# Patient Record
Sex: Male | Born: 1935 | Race: Black or African American | Hispanic: No | Smoking: Former smoker
Health system: Southern US, Community
[De-identification: ages and names within clinical notes are randomized; demographics above are authoritative.]

## PROBLEM LIST (undated history)

## (undated) DIAGNOSIS — R918 Other nonspecific abnormal finding of lung field: Secondary | ICD-10-CM

## (undated) DIAGNOSIS — M1990 Unspecified osteoarthritis, unspecified site: Secondary | ICD-10-CM

## (undated) DIAGNOSIS — J449 Chronic obstructive pulmonary disease, unspecified: Secondary | ICD-10-CM

## (undated) DIAGNOSIS — J479 Bronchiectasis, uncomplicated: Secondary | ICD-10-CM

## (undated) HISTORY — PX: ANKLE FRACTURE SURGERY: SHX122

## (undated) HISTORY — DX: Bronchiectasis, uncomplicated: J47.9

## (undated) HISTORY — DX: Other nonspecific abnormal finding of lung field: R91.8

## (undated) HISTORY — PX: REPLACEMENT TOTAL KNEE: SUR1224

---

## 1991-09-26 HISTORY — PX: ROTATOR CUFF REPAIR: SHX139

## 1998-10-27 ENCOUNTER — Encounter: Payer: Self-pay | Admitting: Emergency Medicine

## 1998-10-27 ENCOUNTER — Emergency Department (HOSPITAL_COMMUNITY): Admission: EM | Admit: 1998-10-27 | Discharge: 1998-10-27 | Payer: Self-pay | Admitting: Emergency Medicine

## 1998-11-08 ENCOUNTER — Ambulatory Visit (HOSPITAL_COMMUNITY): Admission: RE | Admit: 1998-11-08 | Discharge: 1998-11-08 | Payer: Self-pay | Admitting: Endocrinology

## 2000-04-05 ENCOUNTER — Ambulatory Visit (HOSPITAL_COMMUNITY): Admission: RE | Admit: 2000-04-05 | Discharge: 2000-04-05 | Payer: Self-pay | Admitting: Endocrinology

## 2001-05-20 ENCOUNTER — Encounter: Payer: Self-pay | Admitting: Pulmonary Disease

## 2001-05-20 ENCOUNTER — Ambulatory Visit: Admission: RE | Admit: 2001-05-20 | Discharge: 2001-05-20 | Payer: Self-pay | Admitting: Pulmonary Disease

## 2001-09-16 ENCOUNTER — Encounter: Admission: RE | Admit: 2001-09-16 | Discharge: 2001-09-16 | Payer: Self-pay | Admitting: Otolaryngology

## 2001-09-16 ENCOUNTER — Encounter: Payer: Self-pay | Admitting: Otolaryngology

## 2003-05-20 ENCOUNTER — Encounter: Payer: Self-pay | Admitting: Cardiology

## 2003-05-20 ENCOUNTER — Ambulatory Visit (HOSPITAL_COMMUNITY): Admission: RE | Admit: 2003-05-20 | Discharge: 2003-05-20 | Payer: Self-pay | Admitting: Cardiology

## 2004-01-22 ENCOUNTER — Ambulatory Visit (HOSPITAL_COMMUNITY): Admission: RE | Admit: 2004-01-22 | Discharge: 2004-01-22 | Payer: Self-pay | Admitting: Pulmonary Disease

## 2004-12-05 ENCOUNTER — Encounter: Admission: RE | Admit: 2004-12-05 | Discharge: 2004-12-05 | Payer: Self-pay | Admitting: Endocrinology

## 2004-12-23 ENCOUNTER — Ambulatory Visit: Payer: Self-pay | Admitting: Pulmonary Disease

## 2005-01-13 ENCOUNTER — Ambulatory Visit: Payer: Self-pay | Admitting: Pulmonary Disease

## 2005-04-28 ENCOUNTER — Ambulatory Visit: Payer: Self-pay | Admitting: Pulmonary Disease

## 2005-07-13 ENCOUNTER — Emergency Department (HOSPITAL_COMMUNITY): Admission: EM | Admit: 2005-07-13 | Discharge: 2005-07-14 | Payer: Self-pay | Admitting: Emergency Medicine

## 2006-09-25 HISTORY — PX: JOINT REPLACEMENT: SHX530

## 2006-09-27 ENCOUNTER — Inpatient Hospital Stay (HOSPITAL_COMMUNITY): Admission: RE | Admit: 2006-09-27 | Discharge: 2006-09-30 | Payer: Self-pay | Admitting: Orthopedic Surgery

## 2006-10-23 ENCOUNTER — Encounter: Admission: RE | Admit: 2006-10-23 | Discharge: 2006-11-21 | Payer: Self-pay | Admitting: Orthopedic Surgery

## 2007-10-04 ENCOUNTER — Inpatient Hospital Stay (HOSPITAL_COMMUNITY): Admission: AD | Admit: 2007-10-04 | Discharge: 2007-10-08 | Payer: Self-pay | Admitting: *Deleted

## 2008-12-28 ENCOUNTER — Emergency Department (HOSPITAL_COMMUNITY): Admission: EM | Admit: 2008-12-28 | Discharge: 2008-12-28 | Payer: Self-pay | Admitting: Emergency Medicine

## 2010-10-16 ENCOUNTER — Encounter: Payer: Self-pay | Admitting: Pulmonary Disease

## 2011-02-07 NOTE — Op Note (Signed)
NAMEMarland Kitchen  Nicholas Beasley, Nicholas Beasley NO.:  [id number]   MEDICAL RECORD NO.:  [id number]          PATIENT TYPE:  INP   LOCATION:  0012                         FACILITY:  [facility]   PHYSICIAN:  Edsel Petrin. Dalbert Batman, M.D.DATE OF BIRTH:  1926-11-29   DATE OF PROCEDURE:  10/04/2007  DATE OF DISCHARGE:                               OPERATIVE REPORT   PREOPERATIVE DIAGNOSIS:  Colonic perforation.   POSTOPERATIVE DIAGNOSIS:  Colonic perforation of rectosigmoid.   OPERATION PERFORMED:  Exploratory laparotomy and colorrhaphy x1.   SURGEON:  Dr. Fanny Skates.   FIRST ASSISTANT:  Dr. Michael Boston.   OPERATIVE INDICATIONS:  This is a 77 year old black man who was  undergoing elective screening colonoscopy today, and Dr. Lajoyce Corners recognized  a perforation upon entry at about 25 cm.  I was called to see the  patient.  He was brought to operating room urgently for repair of his  perforation.   OPERATIVE FINDINGS:  The patient had a solitary perforation of the  rectosigmoid.  This was noted on the right-hand side of the colonic wall  at about the level of the sacral promontory.  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  The colon appeared pink and  healthy and viable.  There was no gross fecal contamination, but there  was some cloudy watery fluid around the abdomen and pelvis.  There was  no odor.  The fluid was cultured.  I felt no other abnormalities within  the abdomen or pelvis.   OPERATIVE TECHNIQUE:  Following induction of general endotracheal  anesthesia, the patient's abdomen was prepped and draped in a sterile  fashion.  Foley catheter was inserted.  The patient was identified as  the correct patient and correct procedure.  Lower midline laparotomy was  made.  The fascia was incised in the midline and the abdominal cavity  entered and explored under direct vision.  We took aerobic and anaerobic  cultures of the peritoneal fluid.  We then packed away the small  bowel  and examined the entire sigmoid colon and distal descending colon and  the rectum.  We found a solitary perforation on the right lateral wall  of the very proximal rectum or very distal sigmoid.  This was not  bleeding.  It was not necrotic.  There was a little bit of its  mesenteric emphysema in the area but very localized.  I debrided the  edges of the perforation, cleaned off mesenteric fat and closed this in  two layers.  First layer was interrupted sutures of 3-0 Vicryl, taking  full-thickness and placed transversely.  The second layer was also  placed transversely with interrupted inverting sutures of 3-0 silk.  This appeared to be an excellent closure.   We then copiously irrigated the pelvis, abdomen and subphrenic spaces  with about 7 or 8 liters of saline until the fluid was completely clear.  We then removed all the laparotomy pads, checked the small bowel and  colon, and the repair and everything looked fine.  The omentum and small  bowel were returned to their  anatomic positions.  The midline fascia was  closed with a running suture of #1 double-stranded PDS.  The skin was  irrigated and the skin  closed loosely with skin staples, and we placed some Telfa wicks in  between the staples to promote drainage.  Clean bandages were placed,  and the patient was taken to the recovery room in stable condition.  Estimated blood loss was about 50 mL.  Complications none.  Sponge,  needle and instrument counts were correct.      Edsel Petrin. Dalbert Batman, M.D.  Electronically Signed     HMI/MEDQ  D:  10/04/2007  T:  10/04/2007  Job:  [id number]   cc:   Waverly Ferrari, M.D.  [phone]   Ronaldo Miyamoto, M.D.  Fax: [phone]

## 2011-02-07 NOTE — Op Note (Signed)
NAME:  Nicholas Beasley, Nicholas Beasley NO.:  [id number]   MEDICAL RECORD NO.:  [id number]          PATIENT TYPE:  AMB   LOCATION:  ENDO                         FACILITY:  [facility]   PHYSICIAN:  Waverly Ferrari, M.D.    DATE OF BIRTH:  11/02/1926   DATE OF PROCEDURE:  DATE OF DISCHARGE:                               OPERATIVE REPORT   PROCEDURE:  Colonoscopy.   INDICATIONS:  Colon cancer screening.   ANESTHESIA:  Fentanyl 50 mcg, Versed 3 mg.   PROCEDURE:  With the patient mildly sedated in the left lateral  decubitus position, a rectal exam was performed which was unremarkable.  Subsequently, the Pentax videoscopic colonoscope was inserted in the  rectum and passed under a short distance to the sigmoid at which point I  made a turn in the sigmoid and noticed some bleeding.  It was a  difficult turn.  As I pulled back, I noticed that there would have been  a perforation, so I withdrew the colonoscope and terminated the  procedure.   PLAN:  Admission to the hospital with surgical consultation for further  evaluation.           ______________________________  Waverly Ferrari, M.D.     GMO/MEDQ  D:  10/04/2007  T:  10/04/2007  Job:  [id number]

## 2011-02-07 NOTE — Consult Note (Signed)
NAMEMarland Kitchen  Nicholas Beasley, Nicholas Beasley NO.:  [id number]   MEDICAL RECORD NO.:  [id number]          PATIENT TYPE:  INP   LOCATION:  0012                         FACILITY:  [facility]   PHYSICIAN:  Edsel Petrin. Dalbert Batman, M.D.DATE OF BIRTH:  18-Jul-1927   DATE OF CONSULTATION:  DATE OF DISCHARGE:                                 CONSULTATION   REASON FOR CONSULTATION:  Evaluate colonic perforation.   HISTORY OF PRESENT ILLNESS:  This is a 77 year old black man, patient of  Dr. Jim Desanctis.  He has undergone a bowel prep and was undergoing  routine screening colonoscopy today by Dr. Jim Desanctis.  Dr. Lajoyce Corners stated  that he recognized a perforation upon entry at about 25 cm and aborted  the procedure at that time and called me to evaluate the patient.  I  examined the patient, reviewed his history discussed the situation with  his wife as well, and advised that he be taken to operating room for  exploratory laparotomy.   He does not have any history of colon polyps, colon cancer or family  history of colonic disease.  He has had some reflux and heartburn in the  past.  He had all of his questions answered and is in full agreement  with this plan.   PAST HISTORY:  1. Hyperlipidemia.  2. Asthma/bronchitis.  3. Hypertension.   CURRENT MEDICATIONS:  Crestor, Spiriva, Micardis.   DRUG ALLERGIES:  None known.   SOCIAL HISTORY:  The patient is married.  He is an ex-smoker and an ex-  drinker, stopping both of those in 1960.  He is a retired Careers adviser.   FAMILY HISTORY:  Negative for colon cancer or colitis.   REVIEW OF SYSTEMS:  All systems reviewed as best as possible.  They are  noncontributory except as described above.   PHYSICAL EXAMINATION:  Pleasant older black man in mild distress with  abdominal pain.  His wife is with him.  Blood pressure 120/74, pulse 59,  respiratory rate 18 and unlabored.  NECK:  No adenopathy.  No mass.  No crepitus.  No jugular distention.  LUNGS:  Clear to  auscultation.  Slightly distant breath sounds.  HEART:  Regular rate and rhythm.  I do not hear any murmurs or rubs.  ABDOMEN:  Slightly distended, some tenderness and mild guarding in the  lower abdomen but not very dramatic.  There is no mass, there is no  hernia, there are no incisions noted.  EXTREMITIES:  Moves all four extremities well without pain or deformity.  NEUROLOGIC:  No gross motor or sensory deficits.   ASSESSMENT:  1. Colonic perforation of rectosigmoid during elective colonoscopy.  2. Hypertension.  3. Hyperlipidemia.  4. Asthma and bronchitis.   PLAN:  1. The patient will be taken to the operating room for exploratory      laparotomy and repair of his colonic perforation.  2. I have discussed the indications and details of surgery with the      patient and his wife.  Risks and complications have been outlined,      including but  not limited to bleeding; infection; colostomy      formation; injury to adjacent organs with major reconstructive      surgery; wound infection; abscess; anastomotic leak; cardiac,      pulmonary and thromboembolic problems.  They seem to understand      these issues well.  At this time all their questions are answered.      They are in full agreement with this plan.      Edsel Petrin. Dalbert Batman, M.D.  Electronically Signed     HMI/MEDQ  D:  10/04/2007  T:  10/04/2007  Job:  [id number]   cc:   Waverly Ferrari, M.D.  [phone]   Ronaldo Miyamoto, M.D.  Fax: [phone]

## 2011-02-07 NOTE — Consult Note (Signed)
NAME:  JANES, STALLWORTH NO.:  [id number]   MEDICAL RECORD NO.:  [id number]          PATIENT TYPE:  AMB   LOCATION:  ENDO                         FACILITY:  [facility]   PHYSICIAN:  Waverly Ferrari, M.D.    DATE OF BIRTH:  [birth year]   DATE OF CONSULTATION:  10/04/2007  DATE OF DISCHARGE:                                 CONSULTATION   GASTROENTEROLOGY CONSULTATION   HISTORY:  Mr. Beardmore is a 77 year old gentleman being admitted for  followup of colonic perforation.   PAST MEDICAL HISTORY:  I would refer you to outpatient records attached  to the chart, but he has a history of chronic bronchitis, elevated  cholesterol, elevated blood pressure.  He underwent screening  colonoscopy for colon cancer screening.   MEDICATIONS:  His medications include Crestor, Spiriva and Micardis.   He had been well without complaints.   HABITS:  He is an ex-smoker, ex-drinker.   SOCIAL HISTORY:  He is a retired Careers adviser.   PHYSICAL EXAMINATION:  GENERAL APPEARANCE:  At this time he is alert,  oriented, pleasant and in no distress.  VITAL SIGNS:  Blood pressure 120/74, pulse 81, oxygen saturation is 94%.  HEENT:  Unremarkable.  NECK:  No bruits were appreciated.  LUNGS:  Clear.  HEART:  Has regular rhythm without murmurs, rubs or gallops.  ABDOMEN:  Actually fairly benign.   PLAN:  Will admit, get CBC, labs as per surgery and get an EKG and  abdominal films.  Surgery has been called.           ______________________________  Waverly Ferrari, M.D.     GMO/MEDQ  D:  10/04/2007  T:  10/04/2007  Job:  [id number]

## 2011-02-10 NOTE — Op Note (Signed)
[name], [name] NO.:  [id number]   MEDICAL RECORD NO.:  [id number]          PATIENT TYPE:  INP   LOCATION:  X003                         [facility]ACILITY:  [facility]   PHYSICIAN:  Nicholas Beasley, M.D.  DATE OF BIRTH:  August 02, 1927   DATE OF PROCEDURE:  09/27/2006  DATE OF DISCHARGE:                               OPERATIVE REPORT   PREOPERATIVE DIAGNOSIS:  End-stage left knee osteoarthritis.   POSTOPERATIVE DIAGNOSIS:  End-stage left knee osteoarthritis.   PROCEDURE:  Left total knee replacement.   COMPONENTS USED:  DePuy rotating platform, posterior stabilized knee  system with size 5 femur, 5 tibia, 10 mm insert and 41 patella button.   SURGEON:  Nicholas Beasley, M.D.   ASSISTANT:  Nicholas Blase, PA-C   ANESTHESIA:  Duramorph spinal plus MAC.   DRAINS:  Drains x1.   COMPLICATIONS:  None.   TOURNIQUET TIME:  67 minutes at 250 mmHg.   INDICATIONS:  Nicholas Beasley is a very pleasant 77 year old gentleman who  has been to the office with severe end-stage bilateral knee pain.  Radiographs revealed severe degenerative joint disease.  I reviewed with  him treatment options and after consideration of nonoperative versus  operative intervention.  He felt that he was not getting adequate  relief.  He wished to proceed with surgical intervention.  We reviewed  the risks of infection, DVT, component failure, need for revision  surgery, and persistent discomfort postoperatively as well as the  postoperative course.  Plan is for him to have a staged bilateral knees  with the first one done and then the second one done 4 weeks later to  incorporate this into one therapy session.   Consent was obtained.   PROCEDURE IN DETAIL:  The patient was brought to operative theater.  Once adequate anesthesia and preoperative antibiotics, 2 grams Ancef,  were administered the patient was positioned supine.  A proximal thigh  tourniquet was placed. Left lower extremity pre scrubbed and  prepped and  draped in sterile fashion.  A midline incision was made followed by  modified median parapatellar arthrotomy with a patella subluxated rather  than everted.  The patient was noted have severe tricompartmental  osteophytosis as well as large free floating loose bodies.  Following  knee exposure including meniscectomies and debridement of osteophytes to  decompress the joint, attention was directed to the femur.  Femoral  canal was opened, irrigated prevent fat emboli.  Intramedullary rod  passed.  Given the 5 degrees flexion and traction preoperatively I  resected 11 mm of bone off the distal femur.   I then sized the femur to be a size 5.  He had rather neutral position  compared to Whiteside's line so I ended up adding a little bit of  external rotation to the femoral component to make this perpendicular to  Whiteside's line, but also matching epicondylar axis.   Following the sizing determination rotation the size 5 cutting block was  placed. The anterior-posterior chamfer cuts were then made.  Further  debridement of osteophytes was carried out at this point, including  importantly underneath the MCL origin at the distal medial femoral  condyle.   At this point final femoral preparation was carried out with a box  trochlear cut with a size 5 jig.  This was based off the lateral aspect  of distal femur.   At this point attention was directed the tibia.  Further debridement was  carried out to allow for tibial exposure, retractors placed.  I resected  10 mm of bone with the stylus sitting on the lateral proximal tibia in  the central portion.   Following this cut.  There was noted be pretty sclerotic bone medial  proximal tibia and I drilled hole in this.   I sized the tibia cut surface to be best fit with size 5, positioned  this more the lateral aspect of it and debrided osteophytes and  overhanging medial proximal tibia.   Please note that I had placed an  extension block in place and the knee  came out of extension.  It was little tight but I knew that I had a lot  of decompression to continue so I was happy with this cut.  With the  final size 5 tray in place the alignment rod passed down the femur to  the tibia into the ankle and appeared to pass into the center of the  ankle in AP and lateral planes.   I went ahead and then drilled and keel punched the tibia and then a  trial reduction carried out. Again the knee lacked just a few degrees of  full extension but I knew it was tight and I had further debridement to  carry out and would plan to do another trial.  With these trial  components in place, attention was directed to the patella. Patella  precut measure was 25 mm.  I resected down to 15 mm and used a 41  patellar button. Post cut with the patella in place was up to 26 mm.   Following this the trial components removed and attention was directed  to further debridement of posterior aspect the knee with the use of  laminar spreaders. A 5/8 osteotome was used to debride posterior  osteophytes off the medial lateral aspects of the femur.  There was  significant decompression and these were very large osteophytes back  there.  Further debridement of osteophytes was carried out.   Once I was satisfied there was no loose bony fragments or any other  osteophytes that needed to be removed due to the distal palpitation I  went ahead and placed the trial components again with a 5 femur, 5 tibia  and a 10 insert. At this point the knee came out to full extension  easily with stable ligaments from extension to flexion and had a very  good feeling to it.   Given this these trial components removed. The knee was copiously  irrigated normal saline solution.  I injected the knee in the synovium  and capsule with 60 mL of Marcaine with epinephrine and 1 mL of Toradol. The final components were opened and brought into the field including  the  final insert.  Cement was prepared. Components were cemented in  position and tibia, femur then patella. Knee brought out to extension  with a trial insert.  Once the cement had cured, excessive cement was  debrided throughout the knee and none was visualized.  Once I was  satisfied of this the final insert was placed.  The knee was copiously  irrigated with pulse lavage normal saline solution. Prior to closure  medium Hemovac drain was placed, tourniquet was let down at 67 minutes.   With the knee in flexion.  I reapproximated extensor mechanism using #1  Vicryl. The remainder of the wound was closed with 2-0 Vicryl and 4-0  running Monocryl.  The knee was then cleaned, dried and dressed  sterilely with Steri-Strips, and a bulky sterile dressing.  He is  brought to recovery room in stable condition.      Nicholas Cassis Alvan Beasley, M.D.  Electronically Signed     MDO/MEDQ  D:  09/27/2006  T:  09/27/2006  Job:  [id number]

## 2011-02-10 NOTE — H&P (Signed)
NAME:  DANNY, CELIS NO.:  [id number]   MEDICAL RECORD NO.:  [id number]          PATIENT TYPE:  INP   LOCATION:  NA                           FACILITY:  [facility]   PHYSICIAN:  Pietro Cassis. Alvan Dame, M.D.  DATE OF BIRTH:  1927-01-26   DATE OF ADMISSION:  09/27/2006  DATE OF DISCHARGE:                              HISTORY & PHYSICAL   PROCEDURE:  Left total knee replacement.   HISTORY AND PHYSICAL/CHIEF COMPLAINT:  Left knee pain.   HISTORY OF PRESENT ILLNESS:  This is a 77 year old male with a history  of bilateral knee pain.  The left is greater than the right.  He has  been seen in our office for quite some time and has failed all  conservative measures including anti-inflammatories and steroid  injections.  Due to the increased pain and the decreased quality of  life, he has elected to proceed forward with total knee replacement.   PAST MEDICAL HISTORY:  Includes COPD, osteoarthritis,  hypercholesteremia.   PAST SURGICAL HISTORY:  Includes left ankle/right ankle fracture.   SOCIAL HISTORY:  Is married.  Wife will be primary caregiver after  surgery.   DRUG ALLERGIES:  NO KNOWN DRUG ALLERGIES.   MEDICATIONS:  Include Micardis, Crestor, Allegra, glucosamine, aspirins  and Spiriva.   REVIEW OF SYSTEMS:  No new signs or symptoms of any cardiovascular,  respiratory, gastrointestinal, genitourinary, neurologic or  musculoskeletal system complaints.   PHYSICAL EXAMINATION:  VITAL SIGNS:  Temperature 98.3, pulse 68,  respirations 18, blood pressure 142/82.  GENERAL:  Awake, alert and oriented, well-developed, well-nourished, in  no acute distress.  NECK:  Had no carotid bruits.  CHEST/LUNGS: Clear to auscultation bilaterally.  BREASTS:  Deferred.  HEART:  Regular rate and rhythm without gallops, clicks or rubs, [id number]  distinct.  ABDOMEN:  Soft, nontender, nondistended.  Bowel sounds present x4.  GENITOURINARY:  Deferred.  EXTREMITIES:  He has had genu varus.   He has -5 to 100 range of motion  with some pseudolaxity.  SKIN:  Dorsalis pedis pulses positive.  No cellulitis.  NEUROLOGIC:  Intact distal sensibilities.   LABS:  Are pending 09/20/2006 appointment at [facility].  He has been  to see his cardiologist who has done, EKG, chest x-rays as well as  stress echo.   X-rays are pending cardiologist visit.  Knee AP and lateral have been  taken showing end-stage degenerative changes.   IMPRESSION:  Left knee osteoarthritis.   PLAN OF ACTION:  Left total knee arthroplasty 09/27/2006 by surgeon Dr.  Paralee Cancel at [facility].  Risks and complications were  discussed.  Questions were encouraged, answered and reviewed.  We look  forward to treating this very pleasant 77 year old gentleman.     ______________________________  Carlean Jews. Collene Mares, [place]      Pietro Cassis. Alvan Dame, M.D.  Electronically Signed    BLM/MEDQ  D:  09/19/2006  T:  09/19/2006  Job:  [id number]

## 2011-02-10 NOTE — Discharge Summary (Signed)
NAMEMarland Kitchen  Nicholas Beasley, Nicholas Beasley NO.:  [id number]   MEDICAL RECORD NO.:  [id number]          PATIENT TYPE:  INP   LOCATION:  1517                         [facility]ACILITY:  [facility]   PHYSICIAN:  Edsel Petrin. Dalbert Batman, M.D.DATE OF BIRTH:  [birth year]   DATE OF ADMISSION:  10/04/2007  DATE OF DISCHARGE:  10/08/2007                               DISCHARGE SUMMARY   FINAL DIAGNOSES:  1. Accidental perforation of the rectosigmoid colon.  2. Hypertension.  3. Asthmatic bronchitis.  4. Hyperlipidemia.   OPERATIONS PERFORMED:  1. Colonoscopy.  2. Exploratory laparotomy and colorrhaphy.   HISTORY:  This is a 77 year old black man who has asthmatic bronchitis,  hyperlipidemia and hypertension.  He was undergoing routine screening  colonoscopy on the day of admission.  Dr. Lajoyce Corners recognized a perforation  that occurred at about the 25 cm level and called me immediately.  The  patient was admitted directly from the endoscopy unit to the operating  room for operative repair of his colonoscopic laceration.   PHYSICAL EXAMINATION:  A pleasant older black man in mild distress with  abdominal pain.  His wife was with him.  He was mildly sedated.  Blood pressure was 120/74, pulse 59, respiratory rate 18 and unlabored.  HEART:  Revealed a regular rate and rhythm, no murmurs.  ABDOMEN:  Slightly distended. Mild tenderness and mild guarding of the  lower abdomen but not very significant. No mass, no hernia, no  organomegaly, no incisions noted.  EXTREMITIES:  He moved all four extremities well without pain or  deformity.  NEUROLOGIC:  No gross motor or sensory deficits   HOSPITAL COURSE:  The patient was taken directly from the endoscopy  suite to the operating room where he  underwent exploratory laparotomy  through a lower midline incision. I found a very localized perforation  on the right-hand side of the proximal rectum or just at the  rectosigmoid junction. This was very localized, the tissues  were  healthy, there was no sign of any necrosis or bleeding. No sign of any  injury elsewhere.  This area was simply oversewn. As such, there was no  significant fecal contamination just a little bit of cloudy fluid.  Irrigation was performed.   Postoperatively the patient did well.  Peritoneal cultures did grow E-  coli which was no surprise.  He was maintained on intravenous  antibiotics. He had a low grade fever for a day or two but then that  resolved.  He had an ileus for a couple days but then he was able to  assume a diet without any trouble.   On January 13, he was tolerating a regular diet well, was passing  flatus, was afebrile, was asking to go home. His abdomen was soft and  nontender.  I allowed him discharge.  He was given a prescription for  Augmentin for three more days, Vicodin for pain and to continue his  usual medicines.  He was to return to see me in the office in 1 week.      Edsel Petrin. Dalbert Batman, M.D.  Electronically Signed  HMI/MEDQ  D:  11/04/2007  T:  11/04/2007  Job:  [id number]   cc:   Waverly Ferrari, M.D.  Fax: [phone]

## 2011-02-10 NOTE — Discharge Summary (Signed)
NAMEMarland Kitchen  BIKO, ROSENSTEIN NO.:  [id number]   MEDICAL RECORD NO.:  [id number]          PATIENT TYPE:  INP   LOCATION:  [place]                         FACILITY:  [facility]   PHYSICIAN:  Pietro Cassis. Alvan Dame, M.D.  DATE OF BIRTH:  24-Jul-1927   DATE OF ADMISSION:  09/27/2006  DATE OF DISCHARGE:  09/30/2006                               DISCHARGE SUMMARY   ADMITTING DIAGNOSES:  1. Osteoarthritis.  2. Hypertension.  3. Hypercholesteremia.  4. Chronic obstructive pulmonary disease.   DISCHARGE DIAGNOSES:  1. Osteoarthritis.  2. Hypercholesteremia.  3. Chronic obstructive pulmonary disease.  4. Hypertension.   CONSULTATIONS:  None.   PROCEDURE:  Left total knee arthroplasty.  Surgeon was Dr. Adriana Mccallum.  Assistant was [employer] PA.  Components:  Size 5 femur, size 10 insert,  tibial tray size 5 with a 41-mm patella.   BRIEF HISTORY OF PRESENT ILLNESS:  This 77 year old male with bilateral  knee pain with his left greater than right.  He has been seen in our  office for quite some time and is well known to our practice.  All  conservative measures have failed including anti-inflammatories and  steroid injections.  Due to decreased quality of life and persistent  progressive pain, he has been scheduled for a left total knee  replacement.   PREADMISSION LABORATORY:  Showed normal white blood cells, hemoglobin  13.5, hematocrit 39.8.  Upon admission, postoperatively, he was checked  on post-op day #1 and #2, which showed his hemoglobin to be 9.4, 27 on  post-op day #1.  On post-op day #2, he had a white blood cell 10.8,  hemoglobin 9, hematocrit 25.8.  Preadmission white cell differential  within normal limits.  Preadmission coagulation within normal limits.  Preadmission routine chemistry showed sodium 140, potassium 4.3, glucose  114., tracked throughout.  Post-op day #1 and post-op day #2 showed  stable sodium and potassium.  Glucose was 147 on post-op day #1.  Glucose was  125 on post-op day #2.  Preadmission urinalysis was  negative.  Chest x-ray preoperatively showed no acute cardiopulmonary  disease with some multilevel thoracic spondylosis.  EKG showed normal  sinus rhythm.   HOSPITAL COURSE:  The patient underwent left total knee replacement,  tolerated procedure well, was admitted to the orthopedic floor.  Post-op  day #1, pain was well-controlled.  His neuromuscular and vascular  functions remained intact throughout his course of stay.  He was  positive straight leg raise.  We discontinued the Hemovac on post-op day  #1.  We did give him a 500 mL IV bolus to help increase his blood  pressure.  Medicines were held to help increase his blood pressure until  his systolic was greater than 130.  Otherwise, we just followed him  postoperatively routinely.  Home health care PT evaluation recommended  by PT on initial evaluation.  OT recommendation was an elevatted commode  and home health care OT.  Post-op day #2, he was up in a chair.  He was  doing very well.  Dressing was changed and it was clean, dry, and  intact  with no ecchymosis, no ooze.  Post-op day #3, he continued to well.  Dressing was changed once again.  The wound was intact and well-  approximated with no ecchymosis.  His neuromuscular and vascular  functions remained intact with a positive straight leg raise.  Home  health care was recommended with the use of a rolling walker and a  raised commode.  Physical therapy found the patient progressed very well  and was able to ambulate with the use of a rolling walker 120 feet prior  to discharge.   DISCHARGE DISPOSITION:  Discharged home with home health care PT.   DISCHARGE PHYSICAL THERAPY:  Goals of physical therapy will be gait  retraining as well as proprioception.  Want to work on increasing his  range of motion, increasing strength, maximize range of motion,  minimizing pain, and encouraging independence in activities of daily  living.   He is weightbearing as tolerated with the use a rolling-walker.   DISCHARGE WOUND CARE:  Keep wound dry.  May shower, however, cover wound  with an impervious surface.  Redress with a dry dressing on a daily  basis.   DISCHARGE MEDICATIONS:  1. Crestor 10 mg, one p.o. q.a.m.  2. Garlic take as directed, one p.o. q.a.m.  3. Micardis HCTZ 40/12.5, one p.o. q.a.m.  4. Glucosamine chondroitin take as directed.  5. Fish oil omege-3, 1200 mg, one p.o. t.i.d.  6. Spiriva 18 mcg hand held inhaler use q.a.m.  7. Lovenox 30 mg, one subcutaneous q.12 h. x11 days.  8. Norco 05/325, one to two p.o. every four to six hours p.r.n. pain.  9. Robaxin 500 mg, one to two p.o. every four to six hours p.r.n.      muscle spasm pain.  10.Enteric-coated aspirin 325, one p.o. daily x4 weeks, after Lovenox      completed.  11.Ferrous sulfate 325, one p.o. t.i.d. x3 weeks.  12.Colace 100 mg, one p.o. b.i.d. p.r.n. constipation.   DISCHARGE FOLLOWUP:  Follow with Dr. Alvan Dame at [phone] in two weeks for  routine wound care check.  He may return to our office if his condition  worsens.  If he develops any acute shortness of breath or severe calf  pain, please call our emergency services immediately.     ______________________________  Carlean Jews Collene Mares, [place]      Pietro Cassis. Alvan Dame, M.D.  Electronically Signed    BLM/MEDQ  D:  11/01/2006  T:  11/01/2006  Job:  [id number]

## 2011-05-06 ENCOUNTER — Emergency Department (HOSPITAL_COMMUNITY)
Admission: EM | Admit: 2011-05-06 | Discharge: 2011-05-06 | Disposition: A | Discharge status: Home / Self Care | Payer: Medicare Other | Attending: Emergency Medicine | Admitting: Emergency Medicine

## 2011-05-06 ENCOUNTER — Emergency Department (HOSPITAL_COMMUNITY): Payer: Medicare Other

## 2011-05-06 DIAGNOSIS — J4489 Other specified chronic obstructive pulmonary disease: Secondary | ICD-10-CM | POA: Insufficient documentation

## 2011-05-06 DIAGNOSIS — J449 Chronic obstructive pulmonary disease, unspecified: Secondary | ICD-10-CM | POA: Insufficient documentation

## 2011-05-06 DIAGNOSIS — M79609 Pain in unspecified limb: Secondary | ICD-10-CM | POA: Insufficient documentation

## 2011-05-06 DIAGNOSIS — M7989 Other specified soft tissue disorders: Secondary | ICD-10-CM | POA: Insufficient documentation

## 2011-05-06 DIAGNOSIS — Y92009 Unspecified place in unspecified non-institutional (private) residence as the place of occurrence of the external cause: Secondary | ICD-10-CM | POA: Insufficient documentation

## 2011-05-06 DIAGNOSIS — IMO0002 Reserved for concepts with insufficient information to code with codable children: Secondary | ICD-10-CM | POA: Insufficient documentation

## 2011-06-15 LAB — COMPREHENSIVE METABOLIC PANEL
ALT: 15
AST: 22
Alkaline Phosphatase: 97
CO2: 27
Chloride: 105
GFR calc Af Amer: >60
GFR calc non Af Amer: 54 — ABNORMAL LOW
Sodium: 139
Total Bilirubin: 1

## 2011-06-15 LAB — DIFFERENTIAL
Basophils Absolute: 0
Eosinophils Absolute: 0.1
Lymphs Abs: 1.6
Monocytes Absolute: 0.5

## 2011-06-15 LAB — CBC
MCHC: 34.7
Platelets: 167

## 2011-06-15 LAB — BODY FLUID CULTURE

## 2011-06-15 LAB — ANAEROBIC CULTURE

## 2011-08-31 ENCOUNTER — Emergency Department (INDEPENDENT_AMBULATORY_CARE_PROVIDER_SITE_OTHER)
Admission: EM | Admit: 2011-08-31 | Discharge: 2011-08-31 | Disposition: A | Discharge status: Home / Self Care | Payer: Medicare Other | Source: Home / Self Care

## 2011-08-31 NOTE — ED Notes (Signed)
Pt called x1

## 2011-08-31 NOTE — ED Notes (Signed)
Pt called x 3 .  °

## 2011-08-31 NOTE — ED Notes (Signed)
Pt called x 2 

## 2011-12-11 ENCOUNTER — Inpatient Hospital Stay: Admit: 2011-12-11 | Payer: Medicare Other | Admitting: Orthopedic Surgery

## 2011-12-11 SURGERY — ARTHROPLASTY, KNEE, TOTAL
Anesthesia: Spinal | Laterality: Right

## 2012-06-07 NOTE — H&P (Signed)
   Nicholas Beasley is an 78 y.o. male.    Chief Complaint:   Right knee OA and pain   HPI: Pt is a 78 y.o. male complaining of right knee pain for 3-4 years. Pain and swelling have continually increased since the beginning. X-rays in the clinic show end-stage arthritic changes of the right knee. Pt has tried various conservative treatments which have failed to alleviate their symptoms, including joint aspiration, steroid injections and NSAIDs. Various options are discussed with the patient. Risks, benefits and expectations were discussed with the patient. Patient understand the risks, benefits and expectations and wishes to proceed with surgery.   PCP:  Dwan Bolt, MD  D/C Plans:   SNF - [place]  Post-op Meds:   No Rx given   Tranexamic Acid:   To be given  Decadron:   To be given  PMH: Tinnitus   PSH: Left total knee arthroplasty  Social History: He previously smoked, but quit at age 78. Previously drank alcohol, but quit in 1966.  Allergies:  NKDA  Medications: Denies any medications   ROS: Review of Systems  Constitutional: Negative.   HENT: Positive for tinnitus.   Eyes: Negative.   Respiratory: Negative.   Cardiovascular: Negative.   Gastrointestinal: Negative.   Genitourinary: Negative.   Musculoskeletal: Positive for joint pain.  Skin: Negative.   Neurological: Negative.   Endo/Heme/Allergies: Negative.   Psychiatric/Behavioral: Negative.      Physical Exam: BP:   160/84  ;  HR:   80  ; Resp:   16  : Physical Exam  Constitutional: He is oriented to person, place, and time and well-developed, well-nourished, and in no distress.  HENT:  Head: Normocephalic and atraumatic.  Nose: Nose normal.  Mouth/Throat: Oropharynx is clear and moist.  Eyes: Pupils are equal, round, and reactive to light.  Neck: Neck supple. No JVD present. No tracheal deviation present. No thyromegaly present.  Cardiovascular: Normal rate, regular rhythm, normal heart sounds  and intact distal pulses.   Pulmonary/Chest: Effort normal and breath sounds normal. No stridor. No respiratory distress. He has no wheezes.  Abdominal: Soft. There is no tenderness. There is no guarding.  Musculoskeletal:       Right knee: He exhibits decreased range of motion and swelling. He exhibits no effusion, no deformity and no laceration. tenderness found.  Lymphadenopathy:    He has no cervical adenopathy.  Neurological: He is alert and oriented to person, place, and time.  Skin: Skin is warm and dry.  Psychiatric: Affect normal.     Assessment/Plan Assessment:   Right knee OA and pain   Plan: Patient will undergo a right total knee arthroplasty on 06/18/2012 per Dr. Alvan Dame at [facility]. Risks benefits and expectations were discussed with the patient. Patient understand risks, benefits and expectations and wishes to proceed.   [place] Pugh Ameila Weldon   PAC  06/07/2012, 3:10 PM

## 2012-06-10 ENCOUNTER — Encounter (HOSPITAL_COMMUNITY): Payer: Self-pay | Admitting: Pharmacy Technician

## 2012-06-12 ENCOUNTER — Encounter (HOSPITAL_COMMUNITY): Payer: Self-pay

## 2012-06-12 ENCOUNTER — Encounter (HOSPITAL_COMMUNITY)
Admission: RE | Admit: 2012-06-12 | Discharge: 2012-06-12 | Disposition: A | Discharge status: Home / Self Care | Payer: Medicare Other | Source: Ambulatory Visit | Attending: Orthopedic Surgery | Admitting: Orthopedic Surgery

## 2012-06-12 HISTORY — DX: Unspecified osteoarthritis, unspecified site: M19.90

## 2012-06-12 HISTORY — DX: Chronic obstructive pulmonary disease, unspecified: J44.9

## 2012-06-12 LAB — CBC
Hemoglobin: 12.9 g/dL — ABNORMAL LOW (ref 13.0–17.0)
MCH: 28.2 pg (ref 26.0–34.0)
MCHC: 32.6 g/dL (ref 30.0–36.0)
MCV: 86.7 fL (ref 78.0–100.0)
Platelets: 309 10*3/uL (ref 150–400)
RBC: 4.57 MIL/uL (ref 4.22–5.81)

## 2012-06-12 LAB — URINALYSIS, ROUTINE W REFLEX MICROSCOPIC
Bilirubin Urine: NEGATIVE
Glucose, UA: NEGATIVE mg/dL
Hgb urine dipstick: NEGATIVE
Specific Gravity, Urine: 1.015 (ref 1.005–1.030)

## 2012-06-12 LAB — BASIC METABOLIC PANEL
BUN: 10 mg/dL (ref 6–23)
CO2: 26 mEq/L (ref 19–32)
Calcium: 9.7 mg/dL (ref 8.4–10.5)
Glucose, Bld: 101 mg/dL — ABNORMAL HIGH (ref 70–99)
Sodium: 138 mEq/L (ref 135–145)

## 2012-06-12 MED ORDER — CHLORHEXIDINE GLUCONATE 4 % EX LIQD
60.0000 mL | Freq: Once | CUTANEOUS | Status: DC
  Filled 2012-06-12: qty 60

## 2012-06-12 NOTE — Patient Instructions (Signed)
[name]  06/12/2012   Your procedure is scheduled on: 06/18/12 AT 10:30AM    Report to [facility]  At 8:00 AM.  Call this number if you have problems the morning of surgery: [phone]   Remember:   Do not eat food or drink liquids AFTER MIDNIGHT    Take these medicines the morning of surgery with A SIP OF WATER: NONE   Do not wear jewelry, make-up or nail polish.  Do not wear lotions, powders, or perfumes.   Do not shave legs or underarms 48 hrs. before surgery (men may shave face)  Do not bring valuables to the hospital.  Contacts, dentures or bridgework may not be worn into surgery.  Leave suitcase in the car. After surgery it may be brought to your room.  For patients admitted to the hospital, checkout time is 11:00 AM the day of discharge.   Patients discharged the day of surgery will not be allowed to drive home. If going home same day of surgery, must have someone stay with you first 24 hrs at home and arrange for some one to drive you home from hospital.    Special Instructions:   Please read over the following fact sheets that you were given: MRSA  Information               [facility] [place] / Incentive Spirometer          X_______________________________________________________________

## 2012-06-14 NOTE — Progress Notes (Signed)
I spoke with Ovid Curd ( caretaker for Mr Duty) and informed him of time change for surgery to 12:30 and pt to arrive at [facility] Stay at 10:30 am - may have water 12:00 am until 6:30 am

## 2012-06-18 ENCOUNTER — Encounter (HOSPITAL_COMMUNITY)
Admission: RE | Disposition: A | Discharge status: Skilled Nursing Facility | Payer: Self-pay | Source: Ambulatory Visit | Attending: Orthopedic Surgery

## 2012-06-18 ENCOUNTER — Encounter (HOSPITAL_COMMUNITY): Payer: Self-pay | Admitting: *Deleted

## 2012-06-18 ENCOUNTER — Encounter (HOSPITAL_COMMUNITY): Payer: Self-pay | Admitting: Anesthesiology

## 2012-06-18 ENCOUNTER — Inpatient Hospital Stay (HOSPITAL_COMMUNITY)
Admission: RE | Admit: 2012-06-18 | Discharge: 2012-06-21 | DRG: 470 | Disposition: A | Discharge status: Skilled Nursing Facility | Payer: Medicare Other | Source: Ambulatory Visit | Attending: Orthopedic Surgery | Admitting: Orthopedic Surgery

## 2012-06-18 ENCOUNTER — Inpatient Hospital Stay (HOSPITAL_COMMUNITY): Payer: Medicare Other | Admitting: Anesthesiology

## 2012-06-18 DIAGNOSIS — Z01812 Encounter for preprocedural laboratory examination: Secondary | ICD-10-CM

## 2012-06-18 DIAGNOSIS — J4489 Other specified chronic obstructive pulmonary disease: Secondary | ICD-10-CM | POA: Diagnosis present

## 2012-06-18 DIAGNOSIS — Z6826 Body mass index (BMI) 26.0-26.9, adult: Secondary | ICD-10-CM

## 2012-06-18 DIAGNOSIS — Y831 Surgical operation with implant of artificial internal device as the cause of abnormal reaction of the patient, or of later complication, without mention of misadventure at the time of the procedure: Secondary | ICD-10-CM | POA: Diagnosis not present

## 2012-06-18 DIAGNOSIS — E871 Hypo-osmolality and hyponatremia: Secondary | ICD-10-CM

## 2012-06-18 DIAGNOSIS — H9319 Tinnitus, unspecified ear: Secondary | ICD-10-CM | POA: Diagnosis present

## 2012-06-18 DIAGNOSIS — E663 Overweight: Secondary | ICD-10-CM | POA: Diagnosis present

## 2012-06-18 DIAGNOSIS — Z96659 Presence of unspecified artificial knee joint: Secondary | ICD-10-CM

## 2012-06-18 DIAGNOSIS — D62 Acute posthemorrhagic anemia: Secondary | ICD-10-CM

## 2012-06-18 DIAGNOSIS — J449 Chronic obstructive pulmonary disease, unspecified: Secondary | ICD-10-CM | POA: Diagnosis present

## 2012-06-18 DIAGNOSIS — M1710 Unilateral primary osteoarthritis, unspecified knee: Principal | ICD-10-CM | POA: Diagnosis present

## 2012-06-18 DIAGNOSIS — IMO0002 Reserved for concepts with insufficient information to code with codable children: Secondary | ICD-10-CM | POA: Diagnosis not present

## 2012-06-18 DIAGNOSIS — Z87891 Personal history of nicotine dependence: Secondary | ICD-10-CM

## 2012-06-18 HISTORY — PX: TOTAL KNEE ARTHROPLASTY: SHX125

## 2012-06-18 SURGERY — ARTHROPLASTY, KNEE, TOTAL
Anesthesia: Spinal | Site: Knee | Laterality: Right | Wound class: Clean

## 2012-06-18 MED ORDER — MIDAZOLAM HCL 5 MG/5ML IJ SOLN
INTRAMUSCULAR | Status: DC | PRN
  Administered 2012-06-18 (×2): 1 mg via INTRAVENOUS

## 2012-06-18 MED ORDER — CARBOXYMETH-GLYCERIN-POLYSORB 0.5-1-0.5 % OP SOLN: 1.0000 [drp] | Freq: Every day | OPHTHALMIC | Status: DC

## 2012-06-18 MED ORDER — PROMETHAZINE HCL 25 MG/ML IJ SOLN: 6.2500 mg | INTRAMUSCULAR | Status: DC | PRN

## 2012-06-18 MED ORDER — MENTHOL 3 MG MT LOZG: 1.0000 | LOZENGE | OROMUCOSAL | Status: DC | PRN

## 2012-06-18 MED ORDER — FERROUS SULFATE 325 (65 FE) MG PO TABS
325.0000 mg | ORAL_TABLET | Freq: Three times a day (TID) | ORAL | Status: DC
  Administered 2012-06-18 – 2012-06-21 (×8): 325 mg via ORAL
  Filled 2012-06-18 (×11): qty 1

## 2012-06-18 MED ORDER — FENTANYL CITRATE 0.05 MG/ML IJ SOLN: 25.0000 ug | INTRAMUSCULAR | Status: DC | PRN

## 2012-06-18 MED ORDER — BISACODYL 10 MG RE SUPP: 10.0000 mg | Freq: Every day | RECTAL | Status: DC | PRN

## 2012-06-18 MED ORDER — FENTANYL CITRATE 0.05 MG/ML IJ SOLN
INTRAMUSCULAR | Status: DC | PRN
  Administered 2012-06-18: 100 ug via INTRAVENOUS

## 2012-06-18 MED ORDER — 0.9 % SODIUM CHLORIDE (POUR BTL) OPTIME
TOPICAL | Status: DC | PRN
  Administered 2012-06-18: 1000 mL

## 2012-06-18 MED ORDER — DOCUSATE SODIUM 100 MG PO CAPS
100.0000 mg | ORAL_CAPSULE | Freq: Two times a day (BID) | ORAL | Status: DC
  Administered 2012-06-18 – 2012-06-21 (×6): 100 mg via ORAL

## 2012-06-18 MED ORDER — KETOROLAC TROMETHAMINE 30 MG/ML IJ SOLN
INTRAMUSCULAR | Status: DC | PRN
  Administered 2012-06-18: 30 mg via INTRAVENOUS

## 2012-06-18 MED ORDER — DIPHENHYDRAMINE HCL 25 MG PO CAPS: 25.0000 mg | ORAL_CAPSULE | Freq: Four times a day (QID) | ORAL | Status: DC | PRN

## 2012-06-18 MED ORDER — POLYETHYLENE GLYCOL 3350 17 G PO PACK
17.0000 g | PACK | Freq: Two times a day (BID) | ORAL | Status: DC
  Administered 2012-06-18 – 2012-06-21 (×5): 17 g via ORAL

## 2012-06-18 MED ORDER — ONDANSETRON HCL 4 MG PO TABS: 4.0000 mg | ORAL_TABLET | Freq: Four times a day (QID) | ORAL | Status: DC | PRN

## 2012-06-18 MED ORDER — PHENOL 1.4 % MT LIQD: 1.0000 | OROMUCOSAL | Status: DC | PRN

## 2012-06-18 MED ORDER — SODIUM CHLORIDE 0.9 % IR SOLN
Status: DC | PRN
  Administered 2012-06-18: 1000 mL

## 2012-06-18 MED ORDER — LACTATED RINGERS IV SOLN
INTRAVENOUS | Status: DC
  Administered 2012-06-18: 12:00:00 via INTRAVENOUS
  Administered 2012-06-18: 1000 mL via INTRAVENOUS

## 2012-06-18 MED ORDER — KETAMINE HCL 10 MG/ML IJ SOLN
INTRAMUSCULAR | Status: DC | PRN
  Administered 2012-06-18 (×4): 10 mg via INTRAVENOUS

## 2012-06-18 MED ORDER — HYDROCODONE-ACETAMINOPHEN 7.5-325 MG PO TABS
1.0000 | ORAL_TABLET | ORAL | Status: DC
  Administered 2012-06-18 (×3): 1 via ORAL
  Administered 2012-06-19: 2 via ORAL
  Administered 2012-06-19 (×3): 1 via ORAL
  Administered 2012-06-19: 2 via ORAL
  Administered 2012-06-19: 1 via ORAL
  Administered 2012-06-20: 2 via ORAL
  Administered 2012-06-20: 1 via ORAL
  Administered 2012-06-20 – 2012-06-21 (×3): 2 via ORAL
  Filled 2012-06-18 (×2): qty 2
  Filled 2012-06-18 (×2): qty 1
  Filled 2012-06-18: qty 2
  Filled 2012-06-18: qty 1
  Filled 2012-06-18: qty 2
  Filled 2012-06-18: qty 1
  Filled 2012-06-18 (×2): qty 2
  Filled 2012-06-18 (×3): qty 1
  Filled 2012-06-18: qty 2

## 2012-06-18 MED ORDER — CEFAZOLIN SODIUM-DEXTROSE 2-3 GM-% IV SOLR
2.0000 g | INTRAVENOUS | Status: AC
  Administered 2012-06-18: 2 g via INTRAVENOUS

## 2012-06-18 MED ORDER — DEXAMETHASONE SODIUM PHOSPHATE 10 MG/ML IJ SOLN
10.0000 mg | Freq: Once | INTRAMUSCULAR | Status: AC
  Administered 2012-06-19: 10 mg via INTRAVENOUS
  Filled 2012-06-18: qty 1

## 2012-06-18 MED ORDER — ALUM & MAG HYDROXIDE-SIMETH 200-200-20 MG/5ML PO SUSP: 30.0000 mL | ORAL | Status: DC | PRN

## 2012-06-18 MED ORDER — SODIUM CHLORIDE 0.9 % IV SOLN
INTRAVENOUS | Status: AC
  Administered 2012-06-18 – 2012-06-19 (×2): via INTRAVENOUS
  Filled 2012-06-18 (×9): qty 1000

## 2012-06-18 MED ORDER — HYDROMORPHONE HCL PF 1 MG/ML IJ SOLN: 0.5000 mg | INTRAMUSCULAR | Status: DC | PRN

## 2012-06-18 MED ORDER — DEXAMETHASONE SODIUM PHOSPHATE 10 MG/ML IJ SOLN
10.0000 mg | Freq: Once | INTRAMUSCULAR | Status: AC
  Administered 2012-06-18: 10 mg via INTRAVENOUS
  Filled 2012-06-18: qty 1

## 2012-06-18 MED ORDER — TRANEXAMIC ACID 100 MG/ML IV SOLN
15.0000 mg/kg | Freq: Once | INTRAVENOUS | Status: AC
  Administered 2012-06-18: 1170 mg via INTRAVENOUS
  Filled 2012-06-18: qty 11.7

## 2012-06-18 MED ORDER — LACTATED RINGERS IV SOLN: INTRAVENOUS | Status: DC

## 2012-06-18 MED ORDER — CELECOXIB 200 MG PO CAPS
200.0000 mg | ORAL_CAPSULE | Freq: Two times a day (BID) | ORAL | Status: DC
  Administered 2012-06-18 – 2012-06-21 (×6): 200 mg via ORAL
  Filled 2012-06-18 (×7): qty 1

## 2012-06-18 MED ORDER — RIVAROXABAN 10 MG PO TABS
10.0000 mg | ORAL_TABLET | ORAL | Status: DC
  Administered 2012-06-19 – 2012-06-21 (×3): 10 mg via ORAL
  Filled 2012-06-18 (×5): qty 1

## 2012-06-18 MED ORDER — ONDANSETRON HCL 4 MG/2ML IJ SOLN: 4.0000 mg | Freq: Four times a day (QID) | INTRAMUSCULAR | Status: DC | PRN

## 2012-06-18 MED ORDER — METHOCARBAMOL 100 MG/ML IJ SOLN
500.0000 mg | Freq: Four times a day (QID) | INTRAVENOUS | Status: DC | PRN
  Administered 2012-06-18: 500 mg via INTRAVENOUS
  Filled 2012-06-18: qty 5

## 2012-06-18 MED ORDER — PROPOFOL 10 MG/ML IV EMUL
INTRAVENOUS | Status: DC | PRN
  Administered 2012-06-18: 50 ug/kg/min via INTRAVENOUS

## 2012-06-18 MED ORDER — POLYVINYL ALCOHOL 1.4 % OP SOLN
1.0000 [drp] | Freq: Every day | OPHTHALMIC | Status: DC
  Administered 2012-06-18 – 2012-06-21 (×3): 1 [drp] via OPHTHALMIC
  Filled 2012-06-18 (×2): qty 15

## 2012-06-18 MED ORDER — BUPIVACAINE-EPINEPHRINE PF 0.25-1:200000 % IJ SOLN
INTRAMUSCULAR | Status: DC | PRN
  Administered 2012-06-18: 50 mL

## 2012-06-18 MED ORDER — CEFAZOLIN SODIUM-DEXTROSE 2-3 GM-% IV SOLR
2.0000 g | Freq: Four times a day (QID) | INTRAVENOUS | Status: AC
  Administered 2012-06-18 (×2): 2 g via INTRAVENOUS
  Filled 2012-06-18 (×2): qty 50

## 2012-06-18 MED ORDER — FLEET ENEMA 7-19 GM/118ML RE ENEM: 1.0000 | ENEMA | Freq: Once | RECTAL | Status: AC | PRN

## 2012-06-18 MED ORDER — BUPIVACAINE IN DEXTROSE 0.75-8.25 % IT SOLN
INTRATHECAL | Status: DC | PRN
  Administered 2012-06-18: 2 mL via INTRATHECAL

## 2012-06-18 MED ORDER — METHOCARBAMOL 500 MG PO TABS
500.0000 mg | ORAL_TABLET | Freq: Four times a day (QID) | ORAL | Status: DC | PRN
  Administered 2012-06-19: 500 mg via ORAL
  Filled 2012-06-18: qty 1

## 2012-06-18 MED ORDER — ACETAMINOPHEN 10 MG/ML IV SOLN
INTRAVENOUS | Status: DC | PRN
  Administered 2012-06-18: 1000 mg via INTRAVENOUS

## 2012-06-18 MED ORDER — ZOLPIDEM TARTRATE 5 MG PO TABS: 5.0000 mg | ORAL_TABLET | Freq: Every evening | ORAL | Status: DC | PRN

## 2012-06-18 SURGICAL SUPPLY — 59 items
ADH SKN CLS APL DERMABOND .7 (GAUZE/BANDAGES/DRESSINGS) ×1
BAG SPEC THK2 15X12 ZIP CLS (MISCELLANEOUS) ×1
BAG ZIPLOCK 12X15 (MISCELLANEOUS) ×2 IMPLANT
BANDAGE ELASTIC 6 VELCRO ST LF (GAUZE/BANDAGES/DRESSINGS) ×2 IMPLANT
BANDAGE ESMARK 6X9 LF (GAUZE/BANDAGES/DRESSINGS) ×1 IMPLANT
BLADE SAW SGTL 13.0X1.19X90.0M (BLADE) ×2 IMPLANT
BNDG CMPR 9X6 STRL LF SNTH (GAUZE/BANDAGES/DRESSINGS) ×1
BNDG ESMARK 6X9 LF (GAUZE/BANDAGES/DRESSINGS) ×2
BOWL SMART MIX CTS (DISPOSABLE) ×2 IMPLANT
CEMENT HV SMART SET (Cement) ×2 IMPLANT
CLOTH BEACON ORANGE TIMEOUT ST (SAFETY) ×2 IMPLANT
CUFF TOURN SGL QUICK 34 (TOURNIQUET CUFF) ×2
CUFF TRNQT CYL 34X4X40X1 (TOURNIQUET CUFF) ×1 IMPLANT
DECANTER SPIKE VIAL GLASS SM (MISCELLANEOUS) ×2 IMPLANT
DERMABOND ADVANCED (GAUZE/BANDAGES/DRESSINGS) ×1
DERMABOND ADVANCED .7 DNX12 (GAUZE/BANDAGES/DRESSINGS) ×1 IMPLANT
DRAPE EXTREMITY T 121X128X90 (DRAPE) ×2 IMPLANT
DRAPE POUCH INSTRU U-SHP 10X18 (DRAPES) ×2 IMPLANT
DRAPE U-SHAPE 47X51 STRL (DRAPES) ×2 IMPLANT
DRSG AQUACEL AG ADV 3.5X10 (GAUZE/BANDAGES/DRESSINGS) ×2 IMPLANT
DRSG TEGADERM 4X4.75 (GAUZE/BANDAGES/DRESSINGS) ×2 IMPLANT
DURAPREP 26ML APPLICATOR (WOUND CARE) ×2 IMPLANT
ELECT REM PT RETURN 9FT ADLT (ELECTROSURGICAL) ×2
ELECTRODE REM PT RTRN 9FT ADLT (ELECTROSURGICAL) ×1 IMPLANT
EVACUATOR 1/8 PVC DRAIN (DRAIN) ×2 IMPLANT
FACESHIELD LNG OPTICON STERILE (SAFETY) ×10 IMPLANT
GAUZE SPONGE 2X2 8PLY STRL LF (GAUZE/BANDAGES/DRESSINGS) ×1 IMPLANT
GLOVE BIOGEL PI IND STRL 7.5 (GLOVE) ×1 IMPLANT
GLOVE BIOGEL PI IND STRL 8 (GLOVE) ×1 IMPLANT
GLOVE BIOGEL PI INDICATOR 7.5 (GLOVE) ×1
GLOVE BIOGEL PI INDICATOR 8 (GLOVE) ×1
GLOVE ECLIPSE 8.0 STRL XLNG CF (GLOVE) ×2 IMPLANT
GLOVE ORTHO TXT STRL SZ7.5 (GLOVE) ×4 IMPLANT
GOWN BRE IMP PREV XXLGXLNG (GOWN DISPOSABLE) ×4 IMPLANT
GOWN STRL NON-REIN LRG LVL3 (GOWN DISPOSABLE) ×2 IMPLANT
HANDPIECE INTERPULSE COAX TIP (DISPOSABLE) ×2
IMMOBILIZER KNEE 20 (SOFTGOODS) ×2
IMMOBILIZER KNEE 20 THIGH 36 (SOFTGOODS) IMPLANT
KIT BASIN OR (CUSTOM PROCEDURE TRAY) ×2 IMPLANT
MANIFOLD NEPTUNE II (INSTRUMENTS) ×2 IMPLANT
NDL SAFETY ECLIPSE 18X1.5 (NEEDLE) ×1 IMPLANT
NEEDLE HYPO 18GX1.5 SHARP (NEEDLE) ×2
NS IRRIG 1000ML POUR BTL (IV SOLUTION) ×4 IMPLANT
PACK TOTAL JOINT (CUSTOM PROCEDURE TRAY) ×2 IMPLANT
POSITIONER SURGICAL ARM (MISCELLANEOUS) ×2 IMPLANT
SET HNDPC FAN SPRY TIP SCT (DISPOSABLE) ×1 IMPLANT
SET PAD KNEE POSITIONER (MISCELLANEOUS) ×2 IMPLANT
SPONGE GAUZE 2X2 STER 10/PKG (GAUZE/BANDAGES/DRESSINGS) ×1
SUCTION FRAZIER 12FR DISP (SUCTIONS) ×2 IMPLANT
SUT MNCRL AB 4-0 PS2 18 (SUTURE) ×2 IMPLANT
SUT VIC AB 1 CT1 36 (SUTURE) ×6 IMPLANT
SUT VIC AB 2-0 CT1 27 (SUTURE) ×6
SUT VIC AB 2-0 CT1 TAPERPNT 27 (SUTURE) ×3 IMPLANT
SYR 50ML LL SCALE MARK (SYRINGE) ×2 IMPLANT
TOWEL OR 17X26 10 PK STRL BLUE (TOWEL DISPOSABLE) ×4 IMPLANT
TRAY FOLEY CATH 14FRSI W/METER (CATHETERS) ×2 IMPLANT
WATER STERILE IRR 1500ML POUR (IV SOLUTION) ×2 IMPLANT
WRAP KNEE MAXI GEL POST OP (GAUZE/BANDAGES/DRESSINGS) ×2 IMPLANT
YANKAUER SUCT BULB TIP NO VENT (SUCTIONS) ×1 IMPLANT

## 2012-06-18 NOTE — Interval H&P Note (Signed)
History and Physical Interval Note:  06/18/2012 10:37 AM  Nicholas Beasley  has presented today for surgery, with the diagnosis of right knee osteoarthritis  The various methods of treatment have been discussed with the patient and family. After consideration of risks, benefits and other options for treatment, the patient has consented to  Procedure(s) (LRB) with comments: TOTAL KNEE ARTHROPLASTY (Right) as a surgical intervention .  The patient's history has been reviewed, patient examined, no change in status, stable for surgery.  I have reviewed the patient's chart and labs.  Questions were answered to the patient's satisfaction.     [name]

## 2012-06-18 NOTE — Anesthesia Postprocedure Evaluation (Signed)
Anesthesia Post Note  Patient: Nicholas Beasley  Procedure(s) Performed: Procedure(s) (LRB): TOTAL KNEE ARTHROPLASTY (Right)  Anesthesia type: Spinal  Patient location: PACU  Post pain: Pain level controlled  Post assessment: Post-op Vital signs reviewed  Last Vitals:  Filed Vitals:   06/18/12 1406  BP:   Pulse:   Temp: 36.4 C  Resp:     Post vital signs: Reviewed  Level of consciousness: sedated  Complications: No apparent anesthesia complications

## 2012-06-18 NOTE — Op Note (Signed)
NAME:  Nicholas Beasley                      MEDICAL RECORD NO.:  [id number]                             FACILITY:  [facility]      PHYSICIAN:  Pietro Cassis. Alvan Dame, M.D.  DATE OF BIRTH:  [birth year]      DATE OF PROCEDURE:  06/18/2012                                     OPERATIVE REPORT         PREOPERATIVE DIAGNOSIS:  Right knee osteoarthritis.      POSTOPERATIVE DIAGNOSIS:  Right knee osteoarthritis.      FINDINGS:  The patient was noted to have complete loss of cartilage and   bone-on-bone arthritis with associated osteophytes in all 3 compartments of   the knee with a significant synovitis and very large effusion.      PROCEDURE:  Right total knee replacement.      COMPONENTS USED:  DePuy rotating platform posterior stabilized knee   system, a size 5 femur, 5 tibia, 12.5 mm insert, and 41 patellar   button.      SURGEON:  Pietro Cassis. Alvan Dame, M.D.      ASSISTANT:  Danae Orleans, PA-C.      ANESTHESIA:  Spinal.      SPECIMENS:  None.      COMPLICATION:  None.      DRAINS:  One Hemovac.  EBL: <200      TOURNIQUET TIME:   Total Tourniquet Time Documented: Thigh (Right) - 46 minutes .      The patient was stable to the recovery room.      INDICATION FOR PROCEDURE:  Nicholas Beasley is a 78 y.o. male patient of   mine.  The patient had been seen, evaluated, and treated conservatively in the   office with medication, activity modification, and injections.  The patient had   radiographic changes of bone-on-bone arthritis with endplate sclerosis and osteophytes noted.      The patient failed conservative measures including medication, injections, and activity modification, and at this point was ready for more definitive measures.   Based on the radiographic changes and failed conservative measures, the patient   decided to proceed with total knee replacement.  Risks of infection,   DVT, component failure, need for revision surgery, postop course, and   expectations were all   discussed  and reviewed.  Consent was obtained for benefit of pain   relief.      PROCEDURE IN DETAIL:  The patient was brought to the operative theater.   Once adequate anesthesia, preoperative antibiotics, 2 gm of Ancef administered, the patient was positioned supine with the right thigh tourniquet placed.  The  right lower extremity was prepped and draped in sterile fashion.  A time-   out was performed identifying the patient, planned procedure, and   extremity.      The right lower extremity was placed in the [facility] leg holder.  The leg was   exsanguinated, tourniquet elevated to 250 mmHg.  A midline incision was   made followed by median parapatellar arthrotomy.  Following initial   exposure, attention was first directed to the patella.  Precut  measurement was noted to be 25 mm.  I resected down to 15 mm and used a   41 patellar button to restore patellar height as well as cover the cut   surface.      The lug holes were drilled and a metal shim was placed to protect the   patella from retractors and saw blades.      At this point, attention was now directed to the femur.  The femoral   canal was opened with a drill, irrigated to try to prevent fat emboli.  An   intramedullary rod was passed at 5 degrees valgus, 10 mm of bone was   resected off the distal femur.  Following this resection, the tibia was   subluxated anteriorly.  Using the extramedullary guide, 10 mm of bone was resected off   the proximal lateral tibia.  We confirmed the gap would be   stable medially and laterally with a 10 mm insert as well as confirmed   the cut was perpendicular in the coronal plane, checking with an alignment rod.      Once this was done, I sized the femur to be a size 5 in the anterior-   posterior dimension, chose a standard component based on medial and   lateral dimension.  The size 5 rotation block was then pinned in   position anterior referenced using the C-clamp to set rotation.  The    anterior, posterior, and  chamfer cuts were made without difficulty nor   notching making certain that I was along the anterior cortex to help   with flexion gap stability.      The final box cut was made off the lateral aspect of distal femur.      At this point, the tibia was sized to be a size 5, the size 5 tray was   then pinned in position through the medial third of the tubercle,   drilled, and keel punched.  Trial reduction was now carried with a 5 femur,  5 tibia, a 12.5 mm insert, and the 41 patella botton.  The knee was brought to   extension, full extension with good flexion stability with the patella   tracking through the trochlea without application of pressure.  Given   all these findings, the trial components removed.  Final components were   opened and cement was mixed.  The knee was irrigated with normal saline   solution and pulse lavage.  The synovial lining was   then injected with 0.25% Marcaine with epinephrine and 1 cc of Toradol,   total of 61 cc.      The knee was irrigated.  Final implants were then cemented onto clean and   dried cut surfaces of bone with the knee brought to extension with a 12.5   mm trial insert.      Once the cement had fully cured, the excess cement was removed   throughout the knee.  I confirmed I was satisfied with the range of   motion and stability, and the final 12.5 mm PSinsert was chosen.  It was   placed into the knee.      The tourniquet had been let down at 46 minutes.  No significant   hemostasis required.  The medium Hemovac drain was placed deep.  The   extensor mechanism was then reapproximated using #1 Vicryl with the knee   in flexion.  The   remaining wound was closed with 2-0 Vicryl  and running 4-0 Monocryl.   The knee was cleaned, dried, dressed sterilely using Dermabond and   Aquacel dressing.  Drain site dressed separately.  The patient was then   brought to recovery room in stable condition, tolerating the  procedure   well.   Please note that Physician Assistant, Danae Orleans, was present for the entirety of the case, and was utilized for pre-operative positioning, peri-operative retractor management, general facilitation of the procedure.  He was also utilized for primary wound closure at the end of the case.              Pietro Cassis Alvan Dame, M.D.

## 2012-06-18 NOTE — Transfer of Care (Signed)
Immediate Anesthesia Transfer of Care Note  Patient: Nicholas Beasley  Procedure(s) Performed: Procedure(s) (LRB) with comments: TOTAL KNEE ARTHROPLASTY (Right)  Patient Location: PACU  Anesthesia Type: Spinal  Level of Consciousness: awake, oriented and patient cooperative  Airway & Oxygen Therapy: Patient Spontanous Breathing and Patient connected to face mask oxygen  Post-op Assessment: Report given to PACU RN, Post -op Vital signs reviewed and stable and SAB level L1.  Post vital signs: Reviewed and stable  Complications: No apparent anesthesia complications

## 2012-06-18 NOTE — Anesthesia Procedure Notes (Signed)
Spinal  Patient location during procedure: OR Start time: 06/18/2012 11:45 AM End time: 06/18/2012 11:55 AM Staffing Anesthesiologist: Freddie Apley F Performed by: anesthesiologist  Preanesthetic Checklist Completed: patient identified, site marked, surgical consent, pre-op evaluation, timeout performed, IV checked, risks and benefits discussed and monitors and equipment checked Spinal Block Patient position: sitting Prep: Betadine Patient monitoring: heart rate, continuous pulse ox and blood pressure Approach: midline Location: L2-3 Injection technique: single-shot Needle Needle type: Quincke  Needle gauge: 22 G Needle length: 9 cm Additional Notes Expiration date of kit checked and confirmed. Patient tolerated procedure well, without complications. Minimal CSF return despite attempts at SAB at L3-4 without success and then  L2-3. Negative heme/paresthesia Lot [id number] DOE 08/2013

## 2012-06-18 NOTE — Anesthesia Preprocedure Evaluation (Signed)
Anesthesia Evaluation  Patient identified by MRN, date of birth, ID band Patient awake    Reviewed: Allergy & Precautions, H&P , NPO status , Patient's Chart, lab work & pertinent test results  Airway Mallampati: II TM Distance: >3 FB Neck ROM: Full    Dental  (+) Teeth Intact, Dental Advisory Given and Caps,    Pulmonary neg pulmonary ROS, COPD breath sounds clear to auscultation  Pulmonary exam normal       Cardiovascular negative cardio ROS  Rhythm:Regular Rate:Normal     Neuro/Psych negative neurological ROS  negative psych ROS   GI/Hepatic negative GI ROS, Neg liver ROS,   Endo/Other  negative endocrine ROS  Renal/GU negative Renal ROS  negative genitourinary   Musculoskeletal negative musculoskeletal ROS (+)   Abdominal   Peds negative pediatric ROS (+)  Hematology negative hematology ROS (+)   Anesthesia Other Findings   Reproductive/Obstetrics negative OB ROS                           Anesthesia Physical Anesthesia Plan  ASA: II  Anesthesia Plan: Spinal   Post-op Pain Management:    Induction: Intravenous  Airway Management Planned: Simple Face Mask  Additional Equipment:   Intra-op Plan:   Post-operative Plan: Extubation in OR  Informed Consent: I have reviewed the patients History and Physical, chart, labs and discussed the procedure including the risks, benefits and alternatives for the proposed anesthesia with the patient or authorized representative who has indicated his/her understanding and acceptance.   Dental advisory given  Plan Discussed with: CRNA  Anesthesia Plan Comments:         Anesthesia Quick Evaluation

## 2012-06-19 ENCOUNTER — Encounter (HOSPITAL_COMMUNITY): Payer: Self-pay | Admitting: Orthopedic Surgery

## 2012-06-19 DIAGNOSIS — E663 Overweight: Secondary | ICD-10-CM | POA: Insufficient documentation

## 2012-06-19 LAB — CBC
Hemoglobin: 8.5 g/dL — ABNORMAL LOW (ref 13.0–17.0)
RBC: 2.91 MIL/uL — ABNORMAL LOW (ref 4.22–5.81)
WBC: 13.4 10*3/uL — ABNORMAL HIGH (ref 4.0–10.5)

## 2012-06-19 LAB — BASIC METABOLIC PANEL
CO2: 24 mEq/L (ref 19–32)
Chloride: 101 mEq/L (ref 96–112)
Glucose, Bld: 157 mg/dL — ABNORMAL HIGH (ref 70–99)
Potassium: 4.4 mEq/L (ref 3.5–5.1)
Sodium: 133 mEq/L — ABNORMAL LOW (ref 135–145)

## 2012-06-19 NOTE — Evaluation (Addendum)
Physical Therapy Evaluation Patient Details Name: Nicholas Beasley MRN: [id number] DOB: 1927/02/18 Today's Date: 06/19/2012 Time: [id number] PT Time Calculation (min): 28 min  PT Assessment / Plan / Recommendation Clinical Impression  pt is s/p right TKA and will benefit from PT to maximize independence in preparation for next venue of care    PT Assessment  Patient needs continued PT services    Follow Up Recommendations  Skilled nursing facility (pt planning for [place])    Barriers to Discharge        Equipment Recommendations  None recommended by PT    Recommendations for Other Services     Frequency 7X/week    Precautions / Restrictions Precautions Precautions: Knee Restrictions Weight Bearing Restrictions: No RLE Weight Bearing: Weight bearing as tolerated   Pertinent Vitals/Pain       Mobility  Bed Mobility Bed Mobility: Supine to Sit Supine to Sit: 4: Min guard Details for Bed Mobility Assistance:  verbal cues for technique Transfers Transfers: Sit to Stand;Stand to Sit Sit to Stand: 4: Min guard;From bed Stand to Sit: 4: Min guard;To chair/3-in-1 Details for Transfer Assistance: cues for hand placement; Ambulation/Gait Ambulation/Gait Assistance: 4: Min guard Ambulation Distance (Feet): 75 Feet Assistive device: Rolling walker Ambulation/Gait Assistance Details: verbal cues for sequence Rw distance from self Gait Pattern: Step-to pattern;Step-through pattern    Exercises Total Joint Exercises Ankle Circles/Pumps: AROM;15 reps;Both Quad Sets: AROM;Right;10 reps Heel Slides: AROM;AAROM;10 reps;Right Straight Leg Raises: AROM;AAROM;Right;5 reps   PT Diagnosis: Difficulty walking  PT Problem List: Decreased mobility;Decreased activity tolerance;Decreased range of motion;Decreased strength;Decreased knowledge of use of DME PT Treatment Interventions: DME instruction;Gait training;Functional mobility training;Therapeutic activities;Therapeutic exercise;Balance  training;Patient/family education   PT Goals Acute Rehab PT Goals PT Goal Formulation: With patient Time For Goal Achievement: 06/26/12 Potential to Achieve Goals: Good Pt will go Supine/Side to Sit: with supervision PT Goal: Supine/Side to Sit - Progress: Goal set today Pt will go Sit to Stand: with supervision PT Goal: Sit to Stand - Progress: Goal set today Pt will Ambulate: with supervision;51 - 150 feet;with rolling walker PT Goal: Ambulate - Progress: Goal set today Pt will Perform Home Exercise Program: with supervision, verbal cues required/provided PT Goal: Perform Home Exercise Program - Progress: Goal set today  Visit Information  Last PT Received On: 06/19/12 Assistance Needed: +1    Subjective Data  Subjective: my last knee was 5 yrs ago Patient Stated Goal: to rehab   Prior Functioning  Home Living Lives With: Son Available Help at Discharge: Family;Available PRN/intermittently Type of Home: House Home Access: Stairs to enter [employer] of Steps: split level house Additional Comments: plans to go to  [employer] Prior Function Level of Independence: Independent Able to Take Stairs?: Yes Driving: Yes Communication Communication: No difficulties    Cognition  Overall Cognitive Status: Appears within functional limits for tasks assessed/performed Arousal/Alertness: Awake/alert Orientation Level: Appears intact for tasks assessed Behavior During Session: [facility] for tasks performed    Extremity/Trunk Assessment Right Upper Extremity Assessment RUE ROM/Strength/Tone: [facility] for tasks assessed Left Upper Extremity Assessment LUE ROM/Strength/Tone: [facility] for tasks assessed Left Lower Extremity Assessment LLE ROM/Strength/Tone: [facility] for tasks assessed   Balance    End of Session PT - End of Session Equipment Utilized During Treatment: Gait belt Activity Tolerance: Patient tolerated treatment well Patient left: in chair;with call bell/phone within  reach;with family/visitor present Nurse Communication: Mobility status  GP     [facility] 06/19/2012, 9:11 AM

## 2012-06-19 NOTE — Progress Notes (Signed)
06/19/12 1200  PT Visit Information  Last PT Received On 06/19/12  Assistance Needed +1  PT Time Calculation  PT Start Time 1159  PT Stop Time 1226  PT Time Calculation (min) 27 min  Subjective Data  Subjective i have a little pain  Precautions  Precautions Knee  Cognition  Overall Cognitive Status Appears within functional limits for tasks assessed/performed  Arousal/Alertness Awake/alert  Orientation Level Appears intact for tasks assessed  Behavior During Session [facility] for tasks performed  Bed Mobility  Bed Mobility Sit to Supine  Sit to Supine 5: Supervision;6: Modified independent (Device/Increase time)  Details for Bed Mobility Assistance verbal cues for technique  Transfers  Transfers Sit to Stand;Stand to Sit  Sit to Stand 4: Min guard;5: Supervision  Stand to Sit 5: Supervision;4: Min guard  Details for Transfer Assistance subtlecues for hand placement initially  Ambulation/Gait  Ambulation/Gait Assistance 4: Min guard  Ambulation Distance (Feet) 100 Feet  Assistive device Rolling walker  Ambulation/Gait Assistance Details cues for posture  Gait Pattern Step-through pattern  Total Joint Exercises  Ankle Circles/Pumps AROM;15 reps;Both  Quad Sets AROM;Right;10 reps  Heel Slides AROM;AAROM;10 reps;Right  Straight Leg Raises AROM;Right;10 reps  Short Arc Quad AROM;Right;10 reps  Hip ABduction/ADduction AROM;Right;10 reps  PT - End of Session  Activity Tolerance Patient tolerated treatment well  Patient left in bed;with call bell/phone within reach;with family/visitor present  PT - Assessment/Plan  Comments on Treatment Session pt with c/o minimal pain, ice to knee, pt had pain meds one hr prior to PT; doing very well  PT Plan Discharge plan remains appropriate;Frequency remains appropriate  PT Frequency 7X/week  Follow Up Recommendations Skilled nursing facility  Equipment Recommended None recommended by PT  Acute Rehab PT Goals  Time For Goal Achievement  06/26/12  Potential to Achieve Goals Good  Pt will go Supine/Side to Sit with supervision  PT Goal: Supine/Side to Sit - Progress Progressing toward goal  Pt will go Sit to Stand with supervision  PT Goal: Sit to Stand - Progress Progressing toward goal  Pt will Ambulate with supervision;51 - 150 feet;with rolling walker  PT Goal: Ambulate - Progress Progressing toward goal  Pt will Perform Home Exercise Program with supervision, verbal cues required/provided  PT Goal: Perform Home Exercise Program - Progress Progressing toward goal  PT General Charges  $$ ACUTE PT VISIT 1 Procedure  PT Treatments  $Gait Training 8-22 mins  $Therapeutic Exercise 8-22 mins

## 2012-06-19 NOTE — Progress Notes (Signed)
   Subjective: 1 Day Post-Op Procedure(s) (LRB): TOTAL KNEE ARTHROPLASTY (Right)   Patient reports pain as mild, pain well controlled. No events throughout the night.  Objective:   VITALS:   Filed Vitals:   06/19/12 0756  BP: 112/67  Pulse: 88  Temp: 98.6 F (37 C)   Resp: 14    Neurovascular intact Dorsiflexion/Plantar flexion intact Incision: dressing C/D/I No cellulitis present Compartment soft  LABS  Basename 06/19/12 0400  HGB 8.5*  HCT 24.8*  WBC 13.4*  PLT 204     Basename 06/19/12 0400  NA 133*  K 4.4  BUN 13  CREATININE 1.23  GLUCOSE 157*     Assessment/Plan: 1 Day Post-Op Procedure(s) (LRB): TOTAL KNEE ARTHROPLASTY (Right) HV drain d/c'ed Foley cath d/c'ed Advance diet Up with therapy D/C IV fluids Discharge to SNF eventually, when ready.   Expected ABLA  Treated with iron and will observe  Overweight (BMI 25-29.9)  Estimated Body mass index is 26.94 kg/(m^2) as calculated from the following:   Height as of this encounter: 5\' 7" (1.702 m).   Weight as of this encounter: 172 lb(78.019 kg). Patient also counseled that weight may inhibit the healing process Patient counseled that losing weight will help with future health issues  Hyponatremia (Na noted to 133) Treated with IV fluids and will observe      [place] Pugh. Raylon Lamson   PAC  06/19/2012, 9:45 AM

## 2012-06-19 NOTE — Progress Notes (Signed)
Clinical Social Work Department BRIEF PSYCHOSOCIAL ASSESSMENT 06/19/2012  Patient:  Nicholas Beasley, Nicholas Beasley     Account Number:  [id number]     Admit date:  06/18/2012  Clinical Social Worker:  Lacie Scotts  Date/Time:  06/19/2012 08:54 AM  Referred by:  Physician  Date Referred:  06/18/2012 Referred for  SNF Placement   Other Referral:   Interview type:  Patient Other interview type:    PSYCHOSOCIAL DATA Living Status:  FAMILY Admitted from facility:   Level of care:   Primary support name:  Jorje Guild Primary support relationship to patient:  CHILD, ADULT Degree of support available:   Supportive    CURRENT CONCERNS Current Concerns  Post-Acute Placement   Other Concerns:    SOCIAL WORK ASSESSMENT / PLAN Pt is an 78 yr old gentleman living at home prior to hospitalization. CSW met with pt 9/24 to assist with d/c planning. Pt has made prior arrangements to have ST Rehab at [facility] following hospital d/c. CSW has contacted SNF and d/c plan has been confirmed. CSW will follow to assist with d/c planning to SNF.   Assessment/plan status:  Psychosocial Support/Ongoing Assessment of Needs Other assessment/ plan:   Information/referral to community resources:   None needed at this time.    PATIENT'S/FAMILY'S RESPONSE TO PLAN OF CARE: Pt is looking forward to rehab at [facility].   Werner Lean LCSW [phone]

## 2012-06-19 NOTE — Progress Notes (Signed)
Clinical Social Work Department CLINICAL SOCIAL WORK PLACEMENT NOTE 06/19/2012  Patient:  Nicholas Beasley, Nicholas Beasley  Account Number:  [id number] Admit date:  06/18/2012  Clinical Social Worker:  Werner Lean, LCSW  Date/time:  06/19/2012 09:08 AM  Clinical Social Work is seeking post-discharge placement for this patient at the following level of care:   SKILLED NURSING   (*CSW will update this form in Epic as items are completed)     Patient/family provided with [place] Department of Clinical Social Work's list of facilities offering this level of care within the geographic area requested by the patient (or if unable, by the patient's family).    Patient/family informed of their freedom to choose among providers that offer the needed level of care, that participate in Medicare, Medicaid or managed care program needed by the patient, have an available bed and are willing to accept the patient.    Patient/family informed of [facility]' ownership interest in [facility], as well as of the fact that they are under no obligation to receive care at this facility.  PASARR submitted to EDS on 06/18/2012 PASARR number received from EDS on 06/18/2012  FL2 transmitted to all facilities in geographic area requested by pt/family on  06/19/2012 FL2 transmitted to all facilities within larger geographic area on   Patient informed that his/her managed care company has contracts with or will negotiate with  certain facilities, including the following:     Patient/family informed of bed offers received:  06/19/2012 Patient chooses bed at [place] Physician recommends and patient chooses bed at    Patient to be transferred to  on   Patient to be transferred to facility by   The following physician request were entered in Epic:   Additional Comments:  Werner Lean LCSW [phone]

## 2012-06-19 NOTE — Evaluation (Signed)
Occupational Therapy Evaluation Patient Details Name: Nicholas Beasley MRN: [id number] DOB: [birth year] Today's Date: 06/19/2012 Time: [id number] OT Time Calculation (min): 23 min  OT Assessment / Plan / Recommendation Clinical Impression  Pt. is motivated to increase level of independence with ADL's and mobility. Pt. is planning on going to [facility] for rehab.     OT Assessment  Patient needs continued OT Services    Follow Up Recommendations  Skilled nursing facility    Barriers to Discharge Decreased caregiver support    Equipment Recommendations  None recommended by OT    Recommendations for Other Services    Frequency  Min 2X/week    Precautions / Restrictions Precautions Precautions: Knee Restrictions Weight Bearing Restrictions: No RLE Weight Bearing: Weight bearing as tolerated       ADL  Eating/Feeding: Simulated;Independent Where Assessed - Eating/Feeding: Edge of bed Grooming: Simulated;Set up Where Assessed - Grooming: Unsupported sitting Upper Body Bathing: Simulated;Set up Where Assessed - Upper Body Bathing: Unsupported sitting Lower Body Bathing: Simulated;Moderate assistance Where Assessed - Lower Body Bathing: Unsupported standing Upper Body Dressing: Set up Where Assessed - Upper Body Dressing: Unsupported sitting Lower Body Dressing: Maximal assistance Where Assessed - Lower Body Dressing: Unsupported sitting Toilet Transfer: Minimal assistance Toilet Transfer Method: Sit to stand;Stand pivot Toilet Transfer Equipment: Raised toilet seat with arms (or 3-in-1 over toilet) Toileting - Clothing Manipulation and Hygiene: Moderate assistance Where Assessed - Toileting Clothing Manipulation and Hygiene: Sit to stand from 3-in-1 or toilet;Standing Equipment Used: Sock aid;Reacher Transfers/Ambulation Related to ADLs: Min A with transfers ADL Comments: Pt. ed. on use of AE with LE dressing.     OT Diagnosis: Generalized weakness  OT Problem List:  Decreased activity tolerance;Decreased knowledge of use of DME or AE OT Treatment Interventions: Self-care/ADL training;DME and/or AE instruction;Therapeutic activities;Patient/family education   OT Goals Acute Rehab OT Goals OT Goal Formulation: With patient Time For Goal Achievement: 07/03/12 Potential to Achieve Goals: Good ADL Goals Pt Will Perform Lower Body Dressing: with supervision;Unsupported Pt Will Transfer to Toilet: with supervision Pt Will Perform Toileting - Clothing Manipulation: with supervision  Visit Information  Assistance Needed: +1    Subjective Data  Subjective: I am going to rehab   Prior Functioning     Prior Function Level of Independence: Independent Communication Communication: HOH         Vision/Perception     Cognition  Overall Cognitive Status: Appears within functional limits for tasks assessed/performed Arousal/Alertness: Awake/alert Orientation Level: Appears intact for tasks assessed Behavior During Session: [facility] for tasks performed    Extremity/Trunk Assessment Right Upper Extremity Assessment RUE ROM/Strength/Tone: [facility] for tasks assessed Left Upper Extremity Assessment LUE ROM/Strength/Tone: WFL for tasks assessed     Mobility Transfers Transfers: Sit to Stand Sit to Stand: 4: Min assist     Shoulder Instructions     Exercise     Balance     End of Session OT - End of Session Activity Tolerance: Patient tolerated treatment well Patient left: in bed;with call bell/phone within reach  [place] 06/19/2012, 4:21 PM

## 2012-06-19 NOTE — Evaluation (Signed)
Occupational Therapy Evaluation Patient Details Name: Nicholas Beasley MRN: [id number] DOB: 03/04/1927 Today's Date: 06/19/2012 Time: [id number] OT Time Calculation (min): 23 min  OT Assessment / Plan / Recommendation Clinical Impression  Pt. is motivated to increase level of independence with ADL's and mobility. Pt. is planning on going to [facility] for rehab.     OT Assessment  Patient needs continued OT Services    Follow Up Recommendations  Skilled nursing facility    Barriers to Discharge Decreased caregiver support    Equipment Recommendations  None recommended by OT    Recommendations for Other Services    Frequency  Min 2X/week    Precautions / Restrictions Precautions Precautions: Knee Restrictions Weight Bearing Restrictions: No RLE Weight Bearing: Weight bearing as tolerated   Pertinent Vitals/Pain     ADL  Eating/Feeding: Simulated;Independent Where Assessed - Eating/Feeding: Edge of bed Grooming: Simulated;Set up Where Assessed - Grooming: Unsupported sitting Upper Body Bathing: Simulated;Set up Where Assessed - Upper Body Bathing: Unsupported sitting Lower Body Bathing: Simulated;Moderate assistance Where Assessed - Lower Body Bathing: Unsupported standing Upper Body Dressing: Set up Where Assessed - Upper Body Dressing: Unsupported sitting Lower Body Dressing: Maximal assistance Where Assessed - Lower Body Dressing: Unsupported sitting Toilet Transfer: Minimal assistance Toilet Transfer Method: Sit to stand;Stand pivot Toilet Transfer Equipment: Raised toilet seat with arms (or 3-in-1 over toilet) Toileting - Clothing Manipulation and Hygiene: Moderate assistance Where Assessed - Toileting Clothing Manipulation and Hygiene: Sit to stand from 3-in-1 or toilet;Standing Equipment Used: Sock aid;Reacher Transfers/Ambulation Related to ADLs: Min A with transfers ADL Comments: Pt. ed. on use of AE with LE dressing.     OT Diagnosis: Generalized weakness    OT Problem List: Decreased activity tolerance;Decreased knowledge of use of DME or AE OT Treatment Interventions: Self-care/ADL training;DME and/or AE instruction;Therapeutic activities;Patient/family education   OT Goals Acute Rehab OT Goals OT Goal Formulation: With patient Time For Goal Achievement: 07/03/12 Potential to Achieve Goals: Good ADL Goals Pt Will Perform Lower Body Dressing: with supervision;Unsupported Pt Will Transfer to Toilet: with supervision Pt Will Perform Toileting - Clothing Manipulation: with supervision  Visit Information  Assistance Needed: +1    Subjective Data  Subjective: I am going to rehab   Prior Functioning     Prior Function Level of Independence: Independent Communication Communication: HOH         Vision/Perception     Cognition  Overall Cognitive Status: Appears within functional limits for tasks assessed/performed Arousal/Alertness: Awake/alert Orientation Level: Appears intact for tasks assessed Behavior During Session: [facility] for tasks performed    Extremity/Trunk Assessment Right Upper Extremity Assessment RUE ROM/Strength/Tone: [facility] for tasks assessed Left Upper Extremity Assessment LUE ROM/Strength/Tone: WFL for tasks assessed     Mobility Transfers Transfers: Sit to Stand Sit to Stand: 4: Min assist     Shoulder Instructions     Exercise     Balance     End of Session OT - End of Session Activity Tolerance: Patient tolerated treatment well Patient left: in bed;with call bell/phone within reach  GO   Cielle Aguila OT/L  Tauren Delbuono 06/19/2012, 4:18 PM

## 2012-06-20 DIAGNOSIS — D62 Acute posthemorrhagic anemia: Secondary | ICD-10-CM

## 2012-06-20 DIAGNOSIS — E871 Hypo-osmolality and hyponatremia: Secondary | ICD-10-CM

## 2012-06-20 LAB — BASIC METABOLIC PANEL
CO2: 24 mEq/L (ref 19–32)
Calcium: 8.4 mg/dL (ref 8.4–10.5)
Chloride: 97 mEq/L (ref 96–112)
Creatinine, Ser: 1.5 mg/dL — ABNORMAL HIGH (ref 0.50–1.35)
Glucose, Bld: 134 mg/dL — ABNORMAL HIGH (ref 70–99)

## 2012-06-20 LAB — CBC
Hemoglobin: 6.3 g/dL — CL (ref 13.0–17.0)
MCH: 29.1 pg (ref 26.0–34.0)
MCV: 84.5 fL (ref 78.0–100.0)
RBC: 2.2 MIL/uL — ABNORMAL LOW (ref 4.22–5.81)
WBC: 12.3 10*3/uL — ABNORMAL HIGH (ref 4.0–10.5)

## 2012-06-20 MED ORDER — ACETAMINOPHEN 500 MG PO TABS
1000.0000 mg | ORAL_TABLET | Freq: Once | ORAL | Status: AC
  Administered 2012-06-20: 1000 mg via ORAL
  Filled 2012-06-20: qty 2

## 2012-06-20 NOTE — Progress Notes (Signed)
[id number]:  Nursing:  Lab call re: critical HGB result of 6.3.  Arlee Muslim, PA notified, orders received for 2 units PRBCs to be transfused.  Patient A/O, denies any light-headedness or dizziness when OOB.

## 2012-06-20 NOTE — Plan of Care (Signed)
Problem: Consults Goal: Diagnosis- Total Joint Replacement Outcome: Completed/Met Date Met:  06/20/12 Primary Total Knee LEFT

## 2012-06-20 NOTE — Progress Notes (Signed)
   Subjective: 2 Days Post-Op Procedure(s) (LRB): TOTAL KNEE ARTHROPLASTY (Right)   Patient reports pain as mild, pain well controlled. H&H dropped to 6.3/18.4, otherwise he had no events throughout the night.   Objective:   VITALS:   Filed Vitals:   06/20/12 0615  BP: 122/70  Pulse: 78  Temp: 98.4 F (36.9 C)  Resp: 16    Neurovascular intact Dorsiflexion/Plantar flexion intact Incision: dressing C/D/I No cellulitis present Compartment soft  LABS  Basename 06/20/12 0356 06/19/12 0400  HGB 6.3* 8.5*  HCT 18.4* 24.8*  WBC 12.3* 13.4*  PLT 171 204     Basename 06/20/12 0356 06/19/12 0400  NA 128* 133*  K 4.6 4.4  BUN 25* 13  CREATININE 1.50* 1.23  GLUCOSE 134* 157*     Assessment/Plan: 2 Days Post-Op Procedure(s) (LRB): TOTAL KNEE ARTHROPLASTY (Right) Up with therapy Plan for discharge tomorrow to SNF if pain stays controlled and H&H stable  ABLA  Will be transfused with 2 units of blood Additionally will continue with iron  Overweight (BMI 25-29.9)  Estimated Body mass index is 26.94 kg/(m^2) as calculated from the following:   Height as of this encounter: 5\' 7" (1.702 m).   Weight as of this encounter: 172 lb(78.019 kg). Patient also counseled that weight may inhibit the healing process Patient counseled that losing weight will help with future health issues  Hyponatremia Treated with IV fluids and will observe      [place] Pugh. Yaeli Hartung   PAC  06/20/2012, 9:10 AM

## 2012-06-20 NOTE — Progress Notes (Signed)
Physical Therapy Treatment Patient Details Name: Nicholas Beasley MRN: [id number] DOB: [birth year] Today's Date: 06/20/2012 Time: [id number] PT Time Calculation (min): 25 min  PT Assessment / Plan / Recommendation Comments on Treatment Session  POD #2 pm session.  Pt completed one unit of blood so assisted OOB to amb in hallway, positioned in recliner then performed TKR TE's. Pt progressing VERY well.    Follow Up Recommendations  Skilled nursing facility    Barriers to Discharge        Equipment Recommendations       Recommendations for Other Services    Frequency 7X/week   Plan Discharge plan remains appropriate    Precautions / Restrictions     Pertinent Vitals/Pain C/o "soreness' ICE applied    Mobility  Bed Mobility Bed Mobility: Supine to Sit Supine to Sit: 4: Min guard;5: Supervision Details for Bed Mobility Assistance: one VC on proper tech  Transfers Transfers: Sit to Stand;Stand to Sit Sit to Stand: 4: Min guard;From bed Stand to Sit: 4: Min guard;To chair/3-in-1 Details for Transfer Assistance: increased time  Ambulation/Gait Ambulation/Gait Assistance: 4: Min guard Ambulation Distance (Feet): 285 Feet Assistive device: Rolling walker Ambulation/Gait Assistance Details: onr VC to roll RW vs pick up  Gait Pattern: Step-through pattern    Exercises Total Joint Exercises Ankle Circles/Pumps: AROM;Both;10 reps;Supine Quad Sets: AROM;Both;10 reps;Supine Gluteal Sets: AROM;Both;10 reps;Supine Towel Squeeze: AROM;Both;10 reps;Supine Short Arc Quad: AROM;Both;10 reps;Supine Heel Slides: AAROM;Right;10 reps;Supine Straight Leg Raises: AROM;Right;10 reps   PT Diagnosis:    PT Problem List:   PT Treatment Interventions:     PT Goals    Visit Information  Last PT Received On: 06/20/12 Assistance Needed: +1                   End of Session PT - End of Session Equipment Utilized During Treatment: Gait belt Activity Tolerance: Patient tolerated treatment  well Patient left: in chair;with call bell/phone within reach   Rica Koyanagi  PTA [facility]  Acute  Rehab Pager     [phone]

## 2012-06-20 NOTE — Progress Notes (Signed)
AM PT Cancellation Note  _X_ AM PT Treatment cancelled today due to medical issues with patient which prohibited therapy......Marland Kitchenhemoglobin 6.3  ___ Treatment cancelled today due to patient receiving procedure or test   ___ Treatment cancelled today due to patient's refusal to participate   ___ Treatment cancelled today due to  Rica Koyanagi  PTA [facility]  Acute  Rehab Pager     [phone]

## 2012-06-21 LAB — TYPE AND SCREEN
ABO/RH(D): A POS
Antibody Screen: NEGATIVE
Unit division: 0

## 2012-06-21 LAB — HEMOGLOBIN AND HEMATOCRIT, BLOOD
HCT: 24.2 % — ABNORMAL LOW (ref 39.0–52.0)
Hemoglobin: 8.4 g/dL — ABNORMAL LOW (ref 13.0–17.0)

## 2012-06-21 MED ORDER — FERROUS SULFATE 325 (65 FE) MG PO TABS: 325.0000 mg | ORAL_TABLET | Freq: Three times a day (TID) | ORAL | Status: DC

## 2012-06-21 MED ORDER — DIPHENHYDRAMINE HCL 25 MG PO CAPS: 25.0000 mg | ORAL_CAPSULE | Freq: Four times a day (QID) | ORAL | Status: DC | PRN

## 2012-06-21 MED ORDER — POLYETHYLENE GLYCOL 3350 17 G PO PACK: 17.0000 g | PACK | Freq: Two times a day (BID) | ORAL | Status: DC

## 2012-06-21 MED ORDER — ASPIRIN EC 325 MG PO TBEC: 325.0000 mg | DELAYED_RELEASE_TABLET | Freq: Two times a day (BID) | ORAL | Status: DC

## 2012-06-21 MED ORDER — DSS 100 MG PO CAPS: 100.0000 mg | ORAL_CAPSULE | Freq: Two times a day (BID) | ORAL | Status: DC

## 2012-06-21 MED ORDER — HYDROCODONE-ACETAMINOPHEN 7.5-325 MG PO TABS: 1.0000 | ORAL_TABLET | ORAL | Status: DC | PRN

## 2012-06-21 MED ORDER — METHOCARBAMOL 500 MG PO TABS: 500.0000 mg | ORAL_TABLET | Freq: Four times a day (QID) | ORAL | Status: DC | PRN

## 2012-06-21 NOTE — Progress Notes (Signed)
    Subjective: 3 Days Post-Op Procedure(s) (LRB): TOTAL KNEE ARTHROPLASTY (Right)   Patient reports pain as mild, pain well controlled. No events throughout the night. Ready to be discharged to SNF.  Objective:   VITALS:   Filed Vitals:   06/21/12 0610  BP: 127/70  Pulse: 75  Temp: 98.7 F (37.1 C)  Resp: 16    Neurovascular intact Dorsiflexion/Plantar flexion intact Incision: scant drainage No cellulitis present Compartment soft Some swelling of the knee, with probable hematoma superficial vs intra-articular.   LABS  Basename 06/21/12 0730 06/20/12 0356 06/19/12 0400  HGB 8.4* 6.3* 8.5*  HCT 24.2* 18.4* 24.8*  WBC -- 12.3* 13.4*  PLT -- 171 204     Basename 06/20/12 0356 06/19/12 0400  NA 128* 133*  K 4.6 4.4  BUN 25* 13  CREATININE 1.50* 1.23  GLUCOSE 134* 157*     Assessment/Plan: 3 Days Post-Op Procedure(s) (LRB): TOTAL KNEE ARTHROPLASTY (Right) Up with therapy Discharge to SNF Follow up in 2 weeks at [facility]. Follow-up Information    Follow up with OLIN,Joneisha Miles D in 2 weeks.   Contact information:   [facility] [address], [place] [phone]         Hematoma of the right knee Risks, benefits and expectations were discussed with the patient. Patient understand the risks, benefits and expectations and wishes to proceed with the procedure. After prepping the area with Banadyne scrub x3 a needle was inserted in the superficial of the right knee. 25cc of blood were obtained. Patient tolerated the procedure well. The area was cleaned and left with a compressive dressing.  Expected ABLA  Treated with iron and will observe    Overweight (BMI 25-29.9)  Estimated Body mass index is 26.94 kg/(m^2) as calculated from the following:  Height as of this encounter: 5\' 7" (1.702 m).  Weight as of this encounter: 172 lb(78.019 kg).  Patient also counseled that weight may inhibit  the healing process  Patient counseled that losing weight will help with future health issues      West Pugh. Vern Prestia   PAC  06/21/2012, 8:59 AM

## 2012-06-21 NOTE — Progress Notes (Signed)
Clinical Social Work Department CLINICAL SOCIAL WORK PLACEMENT NOTE 06/21/2012  Patient:  Nicholas Beasley, Nicholas Beasley  Account Number:  [id number] Admit date:  06/18/2012  Clinical Social Worker:  Werner Lean, LCSW  Date/time:  06/19/2012 09:08 AM  Clinical Social Work is seeking post-discharge placement for this patient at the following level of care:   SKILLED NURSING   (*CSW will update this form in Epic as items are completed)     Patient/family provided with [place] Department of Clinical Social Work's list of facilities offering this level of care within the geographic area requested by the patient (or if unable, by the patient's family).    Patient/family informed of their freedom to choose among providers that offer the needed level of care, that participate in Medicare, Medicaid or managed care program needed by the patient, have an available bed and are willing to accept the patient.    Patient/family informed of [facility]' ownership interest in [facility], as well as of the fact that they are under no obligation to receive care at this facility.  PASARR submitted to EDS on 06/18/2012 PASARR number received from EDS on 06/18/2012  FL2 transmitted to all facilities in geographic area requested by pt/family on  06/19/2012 FL2 transmitted to all facilities within larger geographic area on   Patient informed that his/her managed care company has contracts with or will negotiate with  certain facilities, including the following:     Patient/family informed of bed offers received:  06/19/2012 Patient chooses bed at [place] Physician recommends and patient chooses bed at    Patient to be transferred to [place] on  06/21/2012 Patient to be transferred to facility by [facility]  The following physician request were entered in Epic:   Additional Comments:  Werner Lean LCSW [phone]

## 2012-06-21 NOTE — Progress Notes (Signed)
Physical Therapy Treatment Patient Details Name: REYAN IZZARD MRN: [id number] DOB: Mar 15, 1927 Today's Date: 06/21/2012 Time: 1004-1030 PT Time Calculation (min): 26 min  PT Assessment / Plan / Recommendation Comments on Treatment Session  POD #3 pt plans to D/C to SNF today.     Follow Up Recommendations  Skilled nursing facility    Barriers to Discharge        Equipment Recommendations       Recommendations for Other Services    Frequency 7X/week   Plan Discharge plan remains appropriate    Precautions / Restrictions Precautions Precautions: Knee Precaution Comments: D/C KI able to perfom 10 active SLR Restrictions Weight Bearing Restrictions: No RLE Weight Bearing: Weight bearing as tolerated    Pertinent Vitals/Pain C/o 6/10 during TE's ICE applied    Mobility  Bed Mobility Bed Mobility: Supine to Sit Supine to Sit: 5: Supervision;4: Min guard Details for Bed Mobility Assistance: increased time  Transfers Transfers: Sit to Stand;Stand to Sit Sit to Stand: 4: Min guard;From bed Stand to Sit: 4: Min guard;To chair/3-in-1 Details for Transfer Assistance: increased time  Ambulation/Gait Ambulation/Gait Assistance: 4: Min guard Ambulation Distance (Feet): 175 Feet Ambulation/Gait Assistance Details: decreased amb distance due to discomfort as pt reports PA drew fluid of his knee this am. ICE applied. Gait Pattern: Step-to pattern Gait velocity: decreased    Exercises Total Joint Exercises Ankle Circles/Pumps: AROM;Right;10 reps;Supine Quad Sets: AROM;Right;10 reps;Supine Gluteal Sets: AROM;Right;10 reps;Supine Towel Squeeze: AROM;Right;10 reps;Supine Short Arc Quad: AAROM;Right;10 reps;Supine Heel Slides: AAROM;Right;10 reps;Supine Hip ABduction/ADduction: AAROM;Right;10 reps;Supine Straight Leg Raises: AROM;Right;10 reps;Supine    PT Goals                       progressing    Visit Information  Last PT Received On: 06/21/12 Assistance Needed: +1                     End of Session PT - End of Session Equipment Utilized During Treatment: Gait belt Activity Tolerance: Patient tolerated treatment well Patient left: in chair;with call bell/phone within reach  Rica Koyanagi  PTA [facility]  Acute  Rehab Pager     [phone]

## 2012-06-21 NOTE — Care Management Note (Signed)
    Page 1 of 2   06/21/2012     3:59:25 PM   CARE MANAGEMENT NOTE 06/21/2012  Patient:  Nicholas Beasley, Nicholas Beasley   Account Number:  [id number]  Date Initiated:  06/21/2012  Documentation initiated by:  Sherrin Daisy  Subjective/Objective Assessment:   dx rt kneee osteoarthritis; total knee replacemnt     Action/Plan:   Plans for SNF rehab   Anticipated DC Date:  06/21/2012   Anticipated DC Plan:  SKILLED NURSING FACILITY  In-house referral  Clinical Social Worker      DC Planning Services  CM consult      [facility] Choice  NA   Choice offered to / List presented to:  NA   DME arranged  NA      DME agency  NA     [place] arranged  NA      [place] agency  NA   Status of service:  Completed, signed off Medicare Important Message given?  NA - LOS <3 / Initial given by admissions (If response is "NO", the following Medicare IM given date fields will be blank) Date Medicare IM given:   Date Additional Medicare IM given:    Discharge Disposition:  [place]  Per UR Regulation:    If discussed at Long Length of Stay Meetings, dates discussed:    Comments:

## 2012-06-21 NOTE — Discharge Summary (Signed)
Physician Discharge Summary  Patient ID: Nicholas Beasley MRN: [id number] DOB/AGE: 76-Nov-1928 76 y.o.  Admit date: 06/18/2012 Discharge date:  06/21/2012  Procedures:  Procedure(s) (LRB): TOTAL KNEE ARTHROPLASTY (Right)  Attending Physician:  Dr. Paralee Cancel   Admission Diagnoses:   Right knee OA and pain   Discharge Diagnoses:  Principal Problem:  *S/P right TKA Active Problems:  Hyponatremia  Acute blood loss anemia Tinnitus   HPI: Pt is a 78 y.o. male complaining of right knee pain for 3-4 years. Pain and swelling have continually increased since the beginning. X-rays in the clinic show end-stage arthritic changes of the right knee. Pt has tried various conservative treatments which have failed to alleviate their symptoms, including joint aspiration, steroid injections and NSAIDs. Various options are discussed with the patient. Risks, benefits and expectations were discussed with the patient. Patient understand the risks, benefits and expectations and wishes to proceed with surgery.  PCP: Dwan Bolt, MD   Discharged Condition: good  Hospital Course:  Patient underwent the above stated procedure on 06/18/2012. Patient tolerated the procedure well and brought to the recovery room in good condition and subsequently to the floor.  POD #1 BP: 112/67 ; Pulse: 88 ; Temp: 98.6 F (37 C) ; Resp: 14  Pt's foley was removed, as well as the hemovac drain removed. IV was changed to a saline lock. Patient reports pain as mild, pain well controlled. No events throughout the night. Neurovascular intact, dorsiflexion/plantar flexion intact, incision: dressing C/D/I, no cellulitis present and compartment soft.   LABS  Basename  06/19/12 0400   HGB  8.5  HCT  24.8   POD #2  BP: 122/70 ; Pulse: 78 ; Temp: 98.4 F (36.9 C) ; Resp: 16  Patient reports pain as mild, pain well controlled. H&H dropped to 6.3/18.4, otherwise he had no events throughout the night. He received 2 unit of  blood which was tolerated well. Neurovascular intact, dorsiflexion/plantar flexion intact, incision: dressing C/D/I, no cellulitis present and compartment soft.   LABS  Basename  06/20/12 0356   HGB  6.3  HCT  18.4   POD #3  BP: 127/70 ; Pulse: 75 ; Temp: 98.7 F (37.1 C) ; Resp: 16  Patient reports pain as mild, pain well controlled. No events throughout the night. Ready to be discharged to SNF. Neurovascular intact, dorsiflexion/plantar flexion intact, incision: dressing has scant drainage, no cellulitis present and compartment soft.  Hematoma of the right knee. Knee was aspirated from a superficial compartment which produced 25 cc of blood. Patient tolerated the procedure well and compressive dressing was applied.  LABS  Basename  06/21/12 0730   HGB  8.4  HCT  24.2    Discharge Exam: General appearance: alert, cooperative and no distress Extremities: Homans sign is negative, no sign of DVT, no edema, redness or tenderness in the calves or thighs and no ulcers, gangrene or trophic changes  Disposition: SNF with follow up in 2 weeks   Follow-up Information    Follow up with [name], MD. In 2 weeks.   Contact information:   [facility] [address] [place] [address] [id number]          Discharge Orders    Future Orders Please Complete By Expires   Diet - low sodium heart healthy      Call MD / Call 911      Comments:   If you experience chest pain or shortness of breath, CALL  911 and be transported to the hospital emergency room.  If you develope a fever above 101 F, pus (white drainage) or increased drainage or redness at the wound, or calf pain, call your surgeon's office.   Discharge instructions      Comments:   Maintain surgical dressing for 4 days, then replace with gauze and tape. Keep the area dry and clean until follow up. Follow up in 2 weeks at [facility]. Call with any questions or concerns.    Constipation Prevention      Comments:   Drink plenty of fluids.  Prune juice may be helpful.  You may use a stool softener, such as Colace (over the counter) 100 mg twice a day.  Use MiraLax (over the counter) for constipation as needed.   Increase activity slowly as tolerated      Driving restrictions      Comments:   No driving for 4 weeks   TED hose      Comments:   Use stockings (TED hose) for 2 weeks on both leg(s).  You may remove them at night for sleeping.   Change dressing      Comments:   Maintain surgical dressing for 4 days, then change the dressing daily with sterile 4 x 4 inch gauze dressing and tape. Keep the area dry and clean.      Current Discharge Medication List    START taking these medications   Details  aspirin EC 325 MG tablet Take 1 tablet (325 mg total) by mouth 2 (two) times daily. X 4 weeks Qty: 60 tablet, Refills: 0    diphenhydrAMINE (BENADRYL) 25 mg capsule Take 1 capsule (25 mg total) by mouth every 6 (six) hours as needed for itching, allergies or sleep. Qty: 30 capsule    docusate sodium 100 MG CAPS Take 100 mg by mouth 2 (two) times daily. Qty: 10 capsule    ferrous sulfate 325 (65 FE) MG tablet Take 1 tablet (325 mg total) by mouth 3 (three) times daily after meals.    HYDROcodone-acetaminophen (NORCO) 7.5-325 MG per tablet Take 1-2 tablets by mouth every 4 (four) hours as needed for pain. Qty: 120 tablet, Refills: 0    methocarbamol (ROBAXIN) 500 MG tablet Take 1 tablet (500 mg total) by mouth every 6 (six) hours as needed (muscle spasms). Qty: 50 tablet, Refills: 0    polyethylene glycol (MIRALAX / GLYCOLAX) packet Take 17 g by mouth 2 (two) times daily. Qty: 14 each      CONTINUE these medications which have NOT CHANGED   Details  Carboxymeth-Glycerin-Polysorb (REFRESH OPTIVE ADVANCED) 0.5-1-0.5 % SOLN Place 1 drop into both eyes daily.         Signed: West Pugh. Jerrine Urschel   PAC  06/21/2012, 9:05 AM

## 2014-11-30 DIAGNOSIS — E789 Disorder of lipoprotein metabolism, unspecified: Secondary | ICD-10-CM | POA: Diagnosis not present

## 2014-12-07 DIAGNOSIS — E789 Disorder of lipoprotein metabolism, unspecified: Secondary | ICD-10-CM | POA: Diagnosis not present

## 2015-05-03 DIAGNOSIS — I10 Essential (primary) hypertension: Secondary | ICD-10-CM | POA: Diagnosis not present

## 2015-05-03 DIAGNOSIS — E789 Disorder of lipoprotein metabolism, unspecified: Secondary | ICD-10-CM | POA: Diagnosis not present

## 2015-05-03 DIAGNOSIS — Z0000 Encounter for general adult medical examination without abnormal findings: Secondary | ICD-10-CM | POA: Diagnosis not present

## 2015-05-03 DIAGNOSIS — Z125 Encounter for screening for malignant neoplasm of prostate: Secondary | ICD-10-CM | POA: Diagnosis not present

## 2015-05-10 DIAGNOSIS — I10 Essential (primary) hypertension: Secondary | ICD-10-CM | POA: Diagnosis not present

## 2015-05-10 DIAGNOSIS — E789 Disorder of lipoprotein metabolism, unspecified: Secondary | ICD-10-CM | POA: Diagnosis not present

## 2015-06-16 DIAGNOSIS — H02831 Dermatochalasis of right upper eyelid: Secondary | ICD-10-CM | POA: Diagnosis not present

## 2015-06-16 DIAGNOSIS — H02834 Dermatochalasis of left upper eyelid: Secondary | ICD-10-CM | POA: Diagnosis not present

## 2015-06-16 DIAGNOSIS — H04123 Dry eye syndrome of bilateral lacrimal glands: Secondary | ICD-10-CM | POA: Diagnosis not present

## 2015-06-16 DIAGNOSIS — H25812 Combined forms of age-related cataract, left eye: Secondary | ICD-10-CM | POA: Diagnosis not present

## 2015-06-16 DIAGNOSIS — Z961 Presence of intraocular lens: Secondary | ICD-10-CM | POA: Diagnosis not present

## 2015-08-30 DIAGNOSIS — R0689 Other abnormalities of breathing: Secondary | ICD-10-CM | POA: Diagnosis not present

## 2015-08-30 DIAGNOSIS — R05 Cough: Secondary | ICD-10-CM | POA: Diagnosis not present

## 2015-08-30 DIAGNOSIS — R0602 Shortness of breath: Secondary | ICD-10-CM | POA: Diagnosis not present

## 2015-08-30 DIAGNOSIS — E789 Disorder of lipoprotein metabolism, unspecified: Secondary | ICD-10-CM | POA: Diagnosis not present

## 2015-09-13 ENCOUNTER — Ambulatory Visit
Admission: RE | Admit: 2015-09-13 | Discharge: 2015-09-13 | Disposition: A | Discharge status: Home / Self Care | Payer: Medicare Other | Source: Ambulatory Visit | Attending: Endocrinology | Admitting: Endocrinology

## 2015-09-13 ENCOUNTER — Other Ambulatory Visit: Payer: Self-pay | Admitting: Endocrinology

## 2015-09-13 DIAGNOSIS — R059 Cough, unspecified: Secondary | ICD-10-CM

## 2015-09-13 DIAGNOSIS — R05 Cough: Secondary | ICD-10-CM | POA: Diagnosis not present

## 2015-09-13 DIAGNOSIS — R918 Other nonspecific abnormal finding of lung field: Secondary | ICD-10-CM | POA: Diagnosis not present

## 2015-09-15 DIAGNOSIS — R899 Unspecified abnormal finding in specimens from other organs, systems and tissues: Secondary | ICD-10-CM | POA: Diagnosis not present

## 2015-09-15 DIAGNOSIS — I10 Essential (primary) hypertension: Secondary | ICD-10-CM | POA: Diagnosis not present

## 2015-09-15 DIAGNOSIS — R05 Cough: Secondary | ICD-10-CM | POA: Diagnosis not present

## 2015-09-22 ENCOUNTER — Ambulatory Visit (INDEPENDENT_AMBULATORY_CARE_PROVIDER_SITE_OTHER): Payer: Medicare Other | Admitting: Pulmonary Disease

## 2015-09-22 ENCOUNTER — Encounter: Payer: Self-pay | Admitting: Pulmonary Disease

## 2015-09-22 VITALS — BP 142/76 | HR 81 | Ht 67.0 in | Wt 160.0 lb

## 2015-09-22 DIAGNOSIS — J479 Bronchiectasis, uncomplicated: Secondary | ICD-10-CM

## 2015-09-22 MED ORDER — ALBUTEROL SULFATE HFA 108 (90 BASE) MCG/ACT IN AERS: 2.0000 | INHALATION_SPRAY | Freq: Four times a day (QID) | RESPIRATORY_TRACT | Status: DC | PRN

## 2015-09-22 NOTE — Progress Notes (Signed)
Past medical history He  has a past medical history of COPD (chronic obstructive pulmonary disease) (HCC) and Arthritis.  Past surgical history He  has past surgical history that includes Joint replacement (Left, 2008 ); Rotator cuff repair (Left, 1993); Ankle fracture surgery (Left, 40 YRS AGO); Total knee arthroplasty (06/18/2012); and Replacement total knee (Right).  Family history His family history includes Heart Problems in his mother; Other in his father.  Social history He  reports that he quit smoking about 56 years ago. His smoking use included Cigarettes. He has a 20 pack-year smoking history. He does not have any smokeless tobacco history on file. He reports that he does not drink alcohol or use illicit drugs.  No Known Allergies  Current Outpatient Prescriptions on File Prior to Visit  Medication Sig  . Carboxymeth-Glycerin-Polysorb (REFRESH OPTIVE ADVANCED) 0.5-1-0.5 % SOLN Place 1 drop into both eyes daily.   No current facility-administered medications on file prior to visit.    Chief Complaint  Patient presents with  . Pulmonary Consult    Referred by Dr Wilson Singer. Last seen 2002 by Dr Alva Garnet. Recent High Res CT on 09/13/15. Pt would like flu vaccine today.    Tests CT chest 09/13/15 >> cylindrical BTX lower lobes, tree in bud, 3 mm LUL nodule, fluid in thoracic esophagus  Vital signs BP 142/76 mmHg  Pulse 81  Ht 5\' 7"  (1.702 m)  Wt 160 lb (72.576 kg)  BMI 25.05 kg/m2  SpO2 97%  History of present illness Nicholas Beasley is a 81 y.o. male former smoker for evaluation of abnormal CT chest.  He was seen previously by Dr. Alva Garnet.  He was told he has COPD.  He had CT chest from 12/05/04 which showed patchy lower lobe ASD and mild BTX.  He was seen by PCP earlier this month, and told he had lab tests that were concerning for a lung infection.  He eventually had CT chest which showed BTX and tree in bud findings with basilar predominance.  He was given two shots and he  felt better.  He gets frequent episodes of cough with clear sputum.  He denies hemoptysis.  He has not had fever recently.  He denies wheeze or chest pain.  There is no hx of skin rash or joint swelling.  He denies dysphagia or odynophagia.  There is no hx of exposure to tuberculosis.  He denies animal/bird exposure.  He works as a Architect.  He was in the [employer]  He quit smoking years ago.  He has not been using inhalers.    Review of Systems  Constitutional: Negative for fever and unexpected weight change.  HENT: Negative for congestion, dental problem, ear pain, nosebleeds, postnasal drip, rhinorrhea, sinus pressure, sneezing, sore throat and trouble swallowing.   Eyes: Negative for redness and itching.  Respiratory: Positive for wheezing. Negative for cough, chest tightness and shortness of breath.   Cardiovascular: Negative for palpitations and leg swelling.  Gastrointestinal: Negative for nausea and vomiting.  Genitourinary: Negative for dysuria.  Musculoskeletal: Negative for joint swelling.  Skin: Negative for rash.  Neurological: Negative for headaches.  Hematological: Does not bruise/bleed easily.  Psychiatric/Behavioral: Negative for dysphoric mood. The patient is not nervous/anxious.    Physical exam  General - No distress, thin ENT - No sinus tenderness, no oral exudate, no LAN, no thyromegaly, TM clear, pupils equal/reactive Cardiac - s1s2 regular, no murmur, pulses symmetric Chest - basilar crackles and squeaks, no wheeze Back - No focal tenderness  Abd - Soft, non-tender, no organomegaly, + bowel sounds Ext - No edema, no clubbing Neuro - Normal strength, cranial nerves intact Skin - No rashes Psych - Normal mood, and behavior   Ct Chest High Resolution  09/13/2015  CLINICAL DATA:  Cough and shortness of breath. Remote history of smoking, quit in 1960. Chest radiograph findings suspicious for basilar interstitial lung disease. EXAM: CT CHEST WITHOUT CONTRAST  TECHNIQUE: Multidetector CT imaging of the chest was performed following the standard protocol without intravenous contrast. High resolution imaging of the lungs, as well as inspiratory and expiratory imaging, was performed. COMPARISON:  08/30/2015 chest radiograph.  12/05/2004 chest CT. FINDINGS: Images are significantly motion degraded, particularly at the lung bases. Mediastinum/Nodes: Normal heart size. No pericardial fluid/thickening. Left main, left anterior descending, left circumflex and right coronary atherosclerosis. Great vessels are normal in course and caliber. Hypodense 2.3 cm right thyroid lobe nodule, not definitely seen on the 2016 chest CT. Fluid level in the mid thoracic esophagus. No pathologically enlarged axillary, mediastinal or gross hilar lymph nodes, noting limited sensitivity for the detection of hilar adenopathy on this noncontrast study. Lungs/Pleura: No pneumothorax. No pleural effusion. A left upper lobe 3 mm solid pulmonary nodule (series 4/ image 15) was not definitely seen on the 2006 chest CT. No acute consolidative airspace disease or lung masses. There is patchy tree-in-bud type opacity in the dependent basilar right middle lobe in throughout the basilar lower lobes bilaterally, similar to the 12/06/2014 chest CT, mildly increased in severity. There is likely associated mild cylindrical bronchiectasis in the basilar lower lobes bilaterally, although this is poorly assessed due to motion artifact. No significant regions of subpleural reticulation, ground-glass attenuation, traction bronchiectasis, parenchymal banding or frank honeycombing. No appreciable emphysema. No significant air trapping on the expiration sequence. Upper abdomen: Unremarkable. Musculoskeletal: No aggressive appearing focal osseous lesions. Moderate degenerative changes in the thoracic spine. IMPRESSION: 1. Motion degraded study. 2. Patchy tree-in-bud opacities with associated mild cylindrical bronchiectasis  in the bilateral basilar lower lobes and basilar right middle lobe, similar in distribution and mildly increased in severity compared to the 12/05/2004 chest CT study. Findings indicate a chronic infectious or inflammatory bronchiolitis, most likely due to recurrent aspiration or less likely atypical mycobacterial infection (MAI). Consider referral to a speech/swallow therapist to evaluate for aspiration. 3. New 3 mm left upper lobe pulmonary nodule. Given risk factors for bronchogenic carcinoma, follow-up chest CT at 1 year is recommended. This recommendation follows the consensus statement: Guidelines for Management of Small Pulmonary Nodules Detected on CT Scans: A Statement from the [employer] as published in Radiology 2005; 237:395-400. 4. Fluid level in the thoracic esophagus, suggesting esophageal dysmotility and/or gastroesophageal reflux. 5. Left main and 3 vessel coronary atherosclerosis. 6. Right thyroid lobe 2.3 cm nodule. If clinically warranted, correlate with thyroid ultrasound. This follows ACR consensus guidelines: Managing Incidental Thyroid Nodules Detected on Imaging: White Paper of the ACR Incidental Thyroid Findings Committee. J Am Coll Radiol 2015; 12:143-150. Electronically Signed   By: Ilona Sorrel M.D.   On: 09/13/2015 13:57    Lab Results  Component Value Date   WBC 12.3* 06/20/2012   HGB 8.4* 06/21/2012   HCT 24.2* 06/21/2012   MCV 84.5 06/20/2012   PLT 171 06/20/2012    Lab Results  Component Value Date   CREATININE 1.50* 06/20/2012   BUN 25* 06/20/2012   NA 128* 06/20/2012   K 4.6 06/20/2012   CL 97 06/20/2012   CO2 24 06/20/2012  Lab Results  Component Value Date   ALT 15 10/04/2007   AST 22 10/04/2007   ALKPHOS 97 10/04/2007   BILITOT 1.0 10/04/2007    Discussion He has persistent cough with sputum production.  He has persistent findings of lower lobe predominate bronchiectasis with some progression.  He has 3 mm lung nodule with hx of smoking.   He carries a diagnosis of COPD.   Assessment/plan  Bronchiectasis. Plan: - will check sputum culture, AFB, fungal culture - will get esophagram to assess for reflux - further assessment/interventions based on above results  Hx of COPD with chronic bronchitis. Plan: - will arrange for pulmonary function testing to assess further - will give him trial of albuterol inhaler prn    Patient Instructions  We are assessing you for Bronchiectasis and Chronic obstructive pulmonary disease (COPD)  Will get sputum sample for culture, AFB, and fungal culture  Will arrange for esophagram xray  Will schedule pulmonary function test  Can try albuterol two puffs every 4 to 6 hours as needed for cough, wheeze, or chest congestion  Follow up in 8 weeks with Dr. Halford Chessman or Tammy Parrett     Chesley Mires, MD [place] Pager:  [phone]

## 2015-09-22 NOTE — Patient Instructions (Signed)
We are assessing you for Bronchiectasis and Chronic obstructive pulmonary disease (COPD)  Will get sputum sample for culture, AFB, and fungal culture  Will arrange for esophagram xray  Will schedule pulmonary function test  Can try albuterol two puffs every 4 to 6 hours as needed for cough, wheeze, or chest congestion  Follow up in 8 weeks with Dr. Halford Chessman or Tammy Parrett

## 2015-09-22 NOTE — Progress Notes (Deleted)
   Subjective:    Patient ID: Nicholas Beasley, male    DOB: Sep 25, 1927, 81 y.o.   MRN: [id number]  HPI    Review of Systems  Constitutional: Negative for fever and unexpected weight change.  HENT: Negative for congestion, dental problem, ear pain, nosebleeds, postnasal drip, rhinorrhea, sinus pressure, sneezing, sore throat and trouble swallowing.   Eyes: Negative for redness and itching.  Respiratory: Positive for wheezing. Negative for cough, chest tightness and shortness of breath.   Cardiovascular: Negative for palpitations and leg swelling.  Gastrointestinal: Negative for nausea and vomiting.  Genitourinary: Negative for dysuria.  Musculoskeletal: Negative for joint swelling.  Skin: Negative for rash.  Neurological: Negative for headaches.  Hematological: Does not bruise/bleed easily.  Psychiatric/Behavioral: Negative for dysphoric mood. The patient is not nervous/anxious.        Objective:   Physical Exam        Assessment & Plan:

## 2015-09-23 ENCOUNTER — Other Ambulatory Visit: Payer: Medicare Other

## 2015-09-23 ENCOUNTER — Other Ambulatory Visit: Payer: Self-pay | Admitting: Pulmonary Disease

## 2015-09-23 DIAGNOSIS — J479 Bronchiectasis, uncomplicated: Secondary | ICD-10-CM | POA: Diagnosis not present

## 2015-09-23 NOTE — Addendum Note (Signed)
Addended by: Virl Cagey on: 09/23/2015 10:22 AM   Modules accepted: Orders

## 2015-09-26 LAB — RESPIRATORY CULTURE OR RESPIRATORY AND SPUTUM CULTURE
CULTURE: NORMAL
GRAM STAIN: NONE SEEN
Organism ID, Bacteria: NORMAL

## 2015-10-04 ENCOUNTER — Other Ambulatory Visit: Payer: Self-pay | Admitting: Pulmonary Disease

## 2015-10-04 ENCOUNTER — Ambulatory Visit (HOSPITAL_COMMUNITY)
Admission: RE | Admit: 2015-10-04 | Discharge: 2015-10-04 | Disposition: A | Discharge status: Home / Self Care | Payer: Medicare Other | Source: Ambulatory Visit | Attending: Pulmonary Disease | Admitting: Pulmonary Disease

## 2015-10-04 DIAGNOSIS — K224 Dyskinesia of esophagus: Secondary | ICD-10-CM | POA: Insufficient documentation

## 2015-10-04 DIAGNOSIS — J479 Bronchiectasis, uncomplicated: Secondary | ICD-10-CM

## 2015-10-04 DIAGNOSIS — R05 Cough: Secondary | ICD-10-CM | POA: Insufficient documentation

## 2015-10-04 DIAGNOSIS — K219 Gastro-esophageal reflux disease without esophagitis: Secondary | ICD-10-CM | POA: Diagnosis present

## 2015-10-08 ENCOUNTER — Telehealth: Payer: Self-pay | Admitting: Pulmonary Disease

## 2015-10-08 NOTE — Telephone Encounter (Signed)
Dg Esophagus  10/04/2015  CLINICAL DATA:  Fluid-filled esophagus on computer tomography of the chest. Some coughing. No dysphasia. EXAM: ESOPHOGRAM / BARIUM SWALLOW / BARIUM TABLET STUDY TECHNIQUE: Combined double contrast and single contrast examination performed using effervescent crystals, thick barium liquid, and thin barium liquid. The patient was observed with fluoroscopy swallowing a 13 mm barium sulphate tablet. FLUOROSCOPY TIME:  Radiation Exposure Index (as provided by the fluoroscopic device): If the device does not provide the exposure index: Fluoroscopy Time:  1 minutes 12 seconds Number of Acquired Images:  5 COMPARISON:  CT 09/13/2015 FINDINGS: There is no mucosal irregularity within the thoracic esophagus or distal esophagus. No swallowing dysfunction in the high cervical esophagus. GE junction is normal. There is mild escape of the barium bolus from the primary stripping wave. No tertiary contractions. 13 mm barium tab passed the GE junction easily. No gastroesophageal reflux demonstrated during the course exam. IMPRESSION: 1. Mild esophageal dysmotility. 2. No mucosal irregularity, stricture, or mass. Electronically Signed   By: Suzy Bouchard M.D.   On: 10/04/2015 11:59    Will have my nurse inform pt that esophagus xray was okay.  Will review in more detail at next visit.

## 2015-10-08 NOTE — Telephone Encounter (Signed)
Results have been explained to patient, pt expressed understanding. Nothing further needed.  

## 2015-10-22 LAB — FUNGUS CULTURE W SMEAR: Smear Result: NONE SEEN

## 2015-11-05 LAB — AFB CULTURE WITH SMEAR (NOT AT ARMC): ACID FAST SMEAR: NONE SEEN

## 2015-11-17 ENCOUNTER — Encounter: Payer: Self-pay | Admitting: Adult Health

## 2015-11-17 ENCOUNTER — Ambulatory Visit (INDEPENDENT_AMBULATORY_CARE_PROVIDER_SITE_OTHER): Payer: Medicare Other | Admitting: Pulmonary Disease

## 2015-11-17 ENCOUNTER — Ambulatory Visit (INDEPENDENT_AMBULATORY_CARE_PROVIDER_SITE_OTHER): Payer: Medicare Other | Admitting: Adult Health

## 2015-11-17 VITALS — BP 112/70 | HR 71 | Temp 98.0°F | Ht 67.0 in | Wt 161.0 lb

## 2015-11-17 DIAGNOSIS — R911 Solitary pulmonary nodule: Secondary | ICD-10-CM | POA: Diagnosis not present

## 2015-11-17 DIAGNOSIS — J479 Bronchiectasis, uncomplicated: Secondary | ICD-10-CM | POA: Diagnosis not present

## 2015-11-17 LAB — PULMONARY FUNCTION TEST
DL/VA % PRED: 90 %
DL/VA: 3.96 ml/min/mmHg/L
DLCO UNC % PRED: 65 %
DLCO cor % pred: 68 %
DLCO cor: 19.52 ml/min/mmHg
DLCO unc: 18.45 ml/min/mmHg
FEF 25-75 PRE: 2.7 L/s
FEF 25-75 Post: 2.81 L/sec
FEF2575-%CHANGE-POST: 4 %
FEF2575-%PRED-POST: 187 %
FEF2575-%Pred-Pre: 179 %
FEV1-%Change-Post: 2 %
FEV1-%Pred-Post: 120 %
FEV1-%Pred-Pre: 118 %
FEV1-PRE: 2.56 L
FEV1-Post: 2.61 L
FEV1FVC-%CHANGE-POST: 1 %
FEV1FVC-%PRED-PRE: 110 %
FEV6-%CHANGE-POST: 1 %
FEV6-%Pred-Post: 111 %
FEV6-%Pred-Pre: 110 %
FEV6-PRE: 3.17 L
FEV6-Post: 3.2 L
FEV6FVC-%Change-Post: 0 %
FEV6FVC-%PRED-PRE: 107 %
FEV6FVC-%Pred-Post: 107 %
FVC-%Change-Post: 0 %
FVC-%PRED-POST: 103 %
FVC-%PRED-PRE: 102 %
FVC-POST: 3.2 L
FVC-Pre: 3.18 L
POST FEV1/FVC RATIO: 82 %
PRE FEV1/FVC RATIO: 81 %
PRE FEV6/FVC RATIO: 100 %
Post FEV6/FVC ratio: 100 %
RV % PRED: 87 %
RV: 2.3 L
TLC % pred: 85 %
TLC: 5.53 L

## 2015-11-17 NOTE — Progress Notes (Signed)
PFT done today. 

## 2015-11-17 NOTE — Progress Notes (Signed)
Subjective:    Patient ID: Nicholas Beasley, male    DOB: Jul 19, 1927, 82 y.o.   MRN: [id number]  HPI 82 yo male seen for pulmonary consult for abnormal CT, COPD and Bronchiectasis   09/22/15 with. Dr. Halford Chessman    TEST  CT chest 09/13/15 >> cylindrical BTX lower lobes, tree in bud, 3 mm LUL nodule, fluid in thoracic esophagus  11/17/2015 Follow up : COPD and Bronchiectasis  Pt returns for 2 month follow up with PFT results.  He was seen last ov with consult for abn CT chest that showed  cylindrical BTX lower lobes, tree in bud, 3 mm LUL nodule, fluid in thoracic esophagus. He is a former smoker  He was set up for a PFT that was done today.  Post BD FEV1 120%, ratio 82  , FVC 103% , DLCO 65% , no sign BD response.  Reviewed results personally with pt.  We discussed follow up CT chest in Dec to follow lung nodule seen on CT .  He has albuterol but does not use it.  Has some DOE and occasional cough that is dry or min clear mucus.  Denies chest pain, orthopnea, edema or discolored mucus .  Barium swallow in Jan showed mild esophageal dysmotility  We discussed these results.  He does eat at night , we discussed aspiration precautions and not to eat 3 hr before bed.  He says overall he feels okay .         Past Medical History  Diagnosis Date  . COPD (chronic obstructive pulmonary disease) ([place])   . Arthritis    Current Outpatient Prescriptions on File Prior to Visit  Medication Sig Dispense Refill  . albuterol (PROVENTIL HFA;VENTOLIN HFA) 108 (90 Base) MCG/ACT inhaler Inhale 2 puffs into the lungs every 6 (six) hours as needed for wheezing or shortness of breath. 1 Inhaler 2  . GARLIC PO Take 1 capsule by mouth daily.    . Vitamin Mixture (VITAMIN E COMPLETE) CAPS Take 1 capsule by mouth daily.    . Carboxymeth-Glycerin-Polysorb (REFRESH OPTIVE ADVANCED) 0.5-1-0.5 % SOLN Place 1 drop into both eyes daily. Reported on 11/17/2015     No current facility-administered medications on file  prior to visit.     Review of Systems Constitutional:   No  weight loss, night sweats,  Fevers, chills, fatigue, or  lassitude.  HEENT:   No headaches,  Difficulty swallowing,  Tooth/dental problems, or  Sore throat,                No sneezing, itching, ear ache, nasal congestion, post nasal drip,   CV:  No chest pain,  Orthopnea, PND, swelling in lower extremities, anasarca, dizziness, palpitations, syncope.   GI  No heartburn, indigestion, abdominal pain, nausea, vomiting, diarrhea, change in bowel habits, loss of appetite, bloody stools.   Resp:  .  No chest wall deformity  Skin: no rash or lesions.  GU: no dysuria, change in color of urine, no urgency or frequency.  No flank pain, no hematuria   MS:  No joint pain or swelling.  No decreased range of motion.  No back pain.  Psych:  No change in mood or affect. No depression or anxiety.  No memory loss.         Objective:   Physical Exam Filed Vitals:   11/17/15 1205  BP: 112/70  Pulse: 71  Temp: 98 F (36.7 C)  TempSrc: Oral  Height: 5\' 7"  (1.702 m)  Weight:  161 lb (73.029 kg)  SpO2: 97%  Body mass index is 25.21 kg/(m^2).   GEN: A/Ox3; pleasant , NAD, elderly   HEENT:  [place]/AT,  EACs-clear, TMs-wnl, NOSE-clear, THROAT-clear, no lesions, no postnasal drip or exudate noted.   NECK:  Supple w/ fair ROM; no JVD; normal carotid impulses w/o bruits; no thyromegaly or nodules palpated; no lymphadenopathy.  RESP  Clear  P & A; w/o, wheezes/ rales/ or rhonchi.no accessory muscle use, no dullness to percussion  CARD:  RRR, no m/r/g  , no peripheral edema, pulses intact, no cyanosis or clubbing.  GI:   Soft & nt; nml bowel sounds; no organomegaly or masses detected.  Musco: Warm bil, no deformities or joint swelling noted.   Neuro: alert, no focal deficits noted.    Skin: Warm, no lesions or rashes         Assessment & Plan:

## 2015-11-17 NOTE — Assessment & Plan Note (Addendum)
Appears stable with no flare  Lung function is normal w/ mild diffucing defect.  Hold on inhalers at this time  Control for GERD/aspiration , precautions reviewed with pt.   Plan  Continue on current regimen  Do not eat 3 hrs prior to bedtime.  Follow up with Dr. Halford Chessman  In 6 months and As needed   Check CT chest in Dec 2017 to follow lung nodule.

## 2015-11-17 NOTE — Patient Instructions (Signed)
Continue on current regimen  Do not eat 3 hrs prior to bedtime.  Follow up with Dr. Halford Chessman  In 6 months and As needed   Check CT chest in Dec 2017 to follow lung nodule.

## 2015-11-24 DIAGNOSIS — R911 Solitary pulmonary nodule: Secondary | ICD-10-CM | POA: Insufficient documentation

## 2015-11-24 NOTE — Assessment & Plan Note (Signed)
CT chest 09/13/15 >> cylindrical BTX lower lobes, tree in bud, 3 mm LUL nodule, fluid in thoracic esophagus  Repeat CT chest in Dec 2017

## 2016-01-06 DIAGNOSIS — E789 Disorder of lipoprotein metabolism, unspecified: Secondary | ICD-10-CM | POA: Diagnosis not present

## 2016-01-06 DIAGNOSIS — I10 Essential (primary) hypertension: Secondary | ICD-10-CM | POA: Diagnosis not present

## 2016-01-13 ENCOUNTER — Other Ambulatory Visit: Payer: Self-pay | Admitting: Endocrinology

## 2016-01-13 DIAGNOSIS — E041 Nontoxic single thyroid nodule: Secondary | ICD-10-CM | POA: Diagnosis not present

## 2016-01-17 ENCOUNTER — Ambulatory Visit
Admission: RE | Admit: 2016-01-17 | Discharge: 2016-01-17 | Disposition: A | Discharge status: Home / Self Care | Payer: Medicare Other | Source: Ambulatory Visit | Attending: Endocrinology | Admitting: Endocrinology

## 2016-01-17 DIAGNOSIS — E041 Nontoxic single thyroid nodule: Secondary | ICD-10-CM

## 2016-01-17 DIAGNOSIS — E042 Nontoxic multinodular goiter: Secondary | ICD-10-CM | POA: Diagnosis not present

## 2016-02-01 DIAGNOSIS — R05 Cough: Secondary | ICD-10-CM | POA: Diagnosis not present

## 2016-02-03 ENCOUNTER — Other Ambulatory Visit: Payer: Self-pay | Admitting: Endocrinology

## 2016-02-03 DIAGNOSIS — E041 Nontoxic single thyroid nodule: Secondary | ICD-10-CM

## 2016-02-04 ENCOUNTER — Ambulatory Visit
Admission: RE | Admit: 2016-02-04 | Discharge: 2016-02-04 | Disposition: A | Discharge status: Home / Self Care | Payer: Medicare Other | Source: Ambulatory Visit | Attending: Endocrinology | Admitting: Endocrinology

## 2016-02-04 ENCOUNTER — Other Ambulatory Visit (HOSPITAL_COMMUNITY)
Admission: RE | Admit: 2016-02-04 | Discharge: 2016-02-04 | Disposition: A | Discharge status: Home / Self Care | Payer: Medicare Other | Source: Ambulatory Visit | Attending: Radiology | Admitting: Radiology

## 2016-02-04 DIAGNOSIS — E041 Nontoxic single thyroid nodule: Secondary | ICD-10-CM | POA: Diagnosis not present

## 2016-02-11 DIAGNOSIS — E041 Nontoxic single thyroid nodule: Secondary | ICD-10-CM | POA: Diagnosis not present

## 2016-02-24 ENCOUNTER — Other Ambulatory Visit: Payer: Self-pay | Admitting: General Surgery

## 2016-02-24 DIAGNOSIS — J479 Bronchiectasis, uncomplicated: Secondary | ICD-10-CM | POA: Diagnosis not present

## 2016-02-24 DIAGNOSIS — E042 Nontoxic multinodular goiter: Secondary | ICD-10-CM | POA: Diagnosis not present

## 2016-03-09 DIAGNOSIS — M47816 Spondylosis without myelopathy or radiculopathy, lumbar region: Secondary | ICD-10-CM | POA: Diagnosis not present

## 2016-03-09 DIAGNOSIS — M160 Bilateral primary osteoarthritis of hip: Secondary | ICD-10-CM | POA: Diagnosis not present

## 2016-03-09 DIAGNOSIS — M545 Low back pain: Secondary | ICD-10-CM | POA: Diagnosis not present

## 2016-04-14 ENCOUNTER — Other Ambulatory Visit: Payer: Self-pay | Admitting: Endocrinology

## 2016-04-14 DIAGNOSIS — M4800 Spinal stenosis, site unspecified: Secondary | ICD-10-CM

## 2016-04-28 ENCOUNTER — Ambulatory Visit
Admission: RE | Admit: 2016-04-28 | Discharge: 2016-04-28 | Disposition: A | Discharge status: Home / Self Care | Payer: Medicare Other | Source: Ambulatory Visit | Attending: Endocrinology | Admitting: Endocrinology

## 2016-04-28 DIAGNOSIS — M4800 Spinal stenosis, site unspecified: Secondary | ICD-10-CM

## 2016-05-04 ENCOUNTER — Ambulatory Visit
Admission: RE | Admit: 2016-05-04 | Discharge: 2016-05-04 | Disposition: A | Discharge status: Home / Self Care | Payer: Medicare Other | Source: Ambulatory Visit | Attending: Endocrinology | Admitting: Endocrinology

## 2016-05-04 ENCOUNTER — Other Ambulatory Visit: Payer: Self-pay | Admitting: Endocrinology

## 2016-05-04 DIAGNOSIS — R42 Dizziness and giddiness: Secondary | ICD-10-CM

## 2016-06-07 DIAGNOSIS — M549 Dorsalgia, unspecified: Secondary | ICD-10-CM | POA: Diagnosis not present

## 2016-06-07 DIAGNOSIS — I10 Essential (primary) hypertension: Secondary | ICD-10-CM | POA: Diagnosis not present

## 2016-06-08 ENCOUNTER — Ambulatory Visit: Payer: Medicare Other | Admitting: Pulmonary Disease

## 2016-06-20 ENCOUNTER — Ambulatory Visit (INDEPENDENT_AMBULATORY_CARE_PROVIDER_SITE_OTHER): Payer: Medicare Other | Admitting: Pulmonary Disease

## 2016-06-20 ENCOUNTER — Encounter: Payer: Self-pay | Admitting: Pulmonary Disease

## 2016-06-20 VITALS — BP 124/78 | HR 78 | Ht 67.0 in | Wt 160.0 lb

## 2016-06-20 DIAGNOSIS — J479 Bronchiectasis, uncomplicated: Secondary | ICD-10-CM | POA: Diagnosis not present

## 2016-06-20 DIAGNOSIS — Z23 Encounter for immunization: Secondary | ICD-10-CM

## 2016-06-20 DIAGNOSIS — J411 Mucopurulent chronic bronchitis: Secondary | ICD-10-CM | POA: Diagnosis not present

## 2016-06-20 NOTE — Patient Instructions (Signed)
Follow up in 1 year.

## 2016-06-20 NOTE — Progress Notes (Signed)
  Current Outpatient Prescriptions on File Prior to Visit  Medication Sig  . albuterol (PROVENTIL HFA;VENTOLIN HFA) 108 (90 Base) MCG/ACT inhaler Inhale 2 puffs into the lungs every 6 (six) hours as needed for wheezing or shortness of breath. (Patient not taking: Reported on 06/20/2016)   No current facility-administered medications on file prior to visit.     Chief Complaint  Patient presents with  . Follow-up    Pt denies any breathing issues at this time. No new complaints.     Tests CT chest 09/13/15 >> cylindrical BTX lower lobes, tree in bud, 3 mm LUL nodule, fluid in thoracic esophagus Esophagram 10/04/15 >> mild esophageal dysmotility  Past medical history Arthritis  Past surgical history, Family history, Social history, Allergies reviewed  Vital signs BP 124/78 (BP Location: Left Arm, Cuff Size: Normal)   Pulse 78   Ht 5\' 7"  (1.702 m)   Wt 160 lb (72.6 kg)   SpO2 96%   BMI 25.06 kg/m   History of present illness Nicholas Beasley is a 82 y.o. male former smoker for evaluation of abnormal CT chest.  He has been doing well.  Gets occasional cough with clear sputum.  Denies fever, hemoptysis, chest pain, sweats, or weight loss.  Uses robitussin and albuterol intermittently.     Physical exam  General - No distress, thin ENT - No sinus tenderness, no oral exudate, no LAN Cardiac - s1s2 regular, no murmur Chest - basilar crackles and squeaks, no wheeze Back - No focal tenderness Abd - Soft, non-tender Ext - No edema, no clubbing Neuro - Normal strength Skin - No rashes Psych - Normal mood, and behavior   Assessment/plan  Hx of COPD with chronic bronchitis and bronchiectasis. - continue prn robitussin and albuterol - he does not want influenza or pneumonia vaccines    Patient Instructions  Follow up in 1 year    Chesley Mires, MD [facility] Pager:  [phone] 06/20/2016, 12:01 PM

## 2016-06-22 DIAGNOSIS — H0232 Blepharochalasis right lower eyelid: Secondary | ICD-10-CM | POA: Diagnosis not present

## 2016-06-22 DIAGNOSIS — H04123 Dry eye syndrome of bilateral lacrimal glands: Secondary | ICD-10-CM | POA: Diagnosis not present

## 2016-06-22 DIAGNOSIS — H0235 Blepharochalasis left lower eyelid: Secondary | ICD-10-CM | POA: Diagnosis not present

## 2016-06-22 DIAGNOSIS — H353131 Nonexudative age-related macular degeneration, bilateral, early dry stage: Secondary | ICD-10-CM | POA: Diagnosis not present

## 2016-06-22 DIAGNOSIS — H40053 Ocular hypertension, bilateral: Secondary | ICD-10-CM | POA: Diagnosis not present

## 2016-08-01 ENCOUNTER — Other Ambulatory Visit: Payer: Self-pay | Admitting: General Surgery

## 2016-08-01 DIAGNOSIS — E041 Nontoxic single thyroid nodule: Secondary | ICD-10-CM

## 2016-08-30 ENCOUNTER — Ambulatory Visit (INDEPENDENT_AMBULATORY_CARE_PROVIDER_SITE_OTHER)
Admission: RE | Admit: 2016-08-30 | Discharge: 2016-08-30 | Disposition: A | Discharge status: Home / Self Care | Payer: Medicare Other | Source: Ambulatory Visit | Attending: Adult Health | Admitting: Adult Health

## 2016-08-30 DIAGNOSIS — R911 Solitary pulmonary nodule: Secondary | ICD-10-CM

## 2016-09-04 ENCOUNTER — Other Ambulatory Visit: Payer: Self-pay | Admitting: Adult Health

## 2016-09-04 DIAGNOSIS — R911 Solitary pulmonary nodule: Secondary | ICD-10-CM

## 2016-09-06 ENCOUNTER — Other Ambulatory Visit: Payer: Medicare Other

## 2016-09-07 DIAGNOSIS — I10 Essential (primary) hypertension: Secondary | ICD-10-CM | POA: Diagnosis not present

## 2016-09-08 ENCOUNTER — Ambulatory Visit
Admission: RE | Admit: 2016-09-08 | Discharge: 2016-09-08 | Disposition: A | Discharge status: Home / Self Care | Payer: Medicare Other | Source: Ambulatory Visit | Attending: General Surgery | Admitting: General Surgery

## 2016-09-08 DIAGNOSIS — E042 Nontoxic multinodular goiter: Secondary | ICD-10-CM | POA: Diagnosis not present

## 2016-09-08 DIAGNOSIS — E041 Nontoxic single thyroid nodule: Secondary | ICD-10-CM

## 2016-09-29 IMAGING — RF DG ESOPHAGUS
6 series · 12 of 12 positions shown · non-contrast
Comparison: CT 09/13/2015

CLINICAL DATA: Fluid-filled esophagus on computer tomography of the
chest. Some coughing. No dysphasia.

EXAM:
ESOPHOGRAM / BARIUM SWALLOW / BARIUM TABLET STUDY
TECHNIQUE: Combined double contrast and single contrast examination performed
using effervescent crystals, thick barium liquid, and thin barium
liquid. The patient was observed with fluoroscopy swallowing a 13 mm
barium sulphate tablet.
FLUOROSCOPY TIME:  Radiation Exposure Index (as provided by the
fluoroscopic device):
If the device does not provide the exposure index:
Fluoroscopy Time:  1 minutes 12 seconds
Number of Acquired Images:  5

[Series 1: fluoro_barium 2fps_bw · 0.18mm/px · 1 of 1 slices shown (1 of 4)]
[im 1/1]
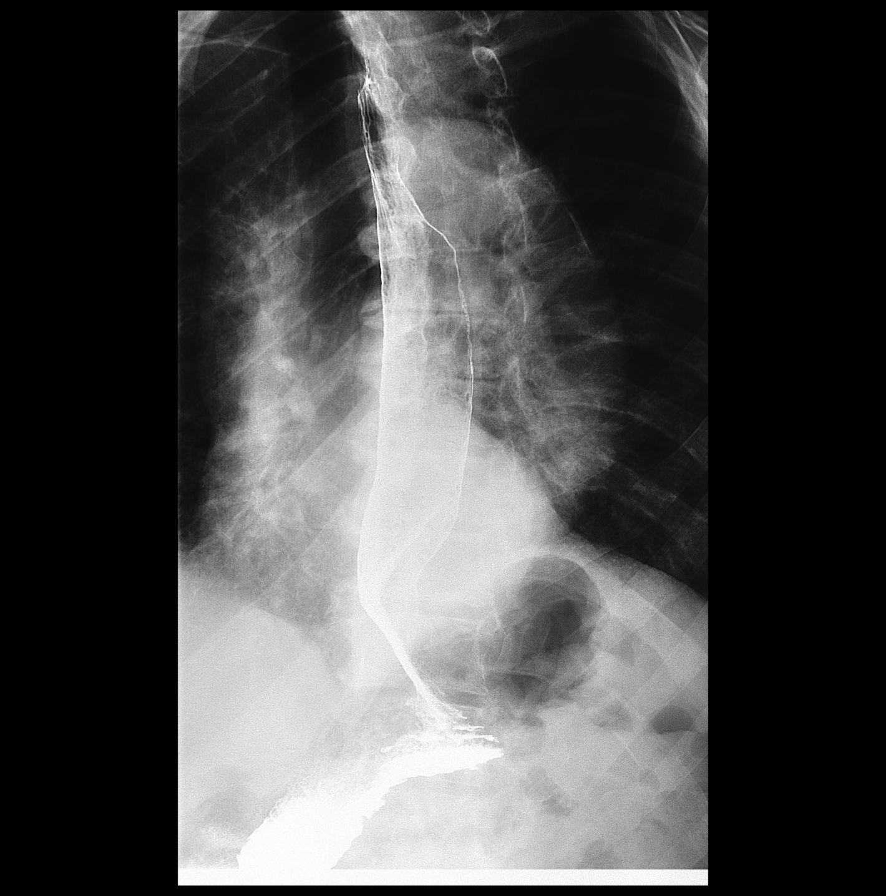

[Series 2: cp_standard · 0.37mm/px · 4 of 32 frames shown (1 of 2)]
[frame 4/32]
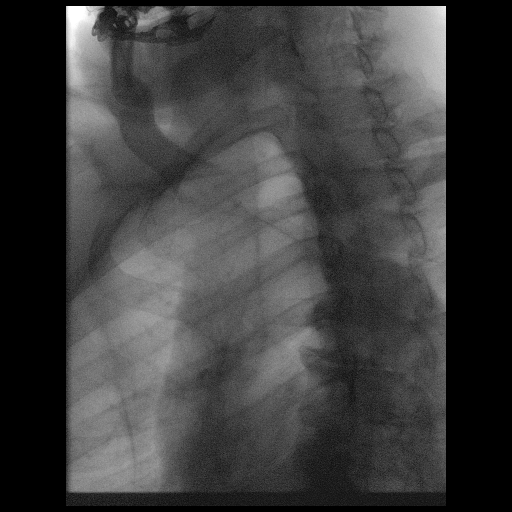
[frame 5/32]
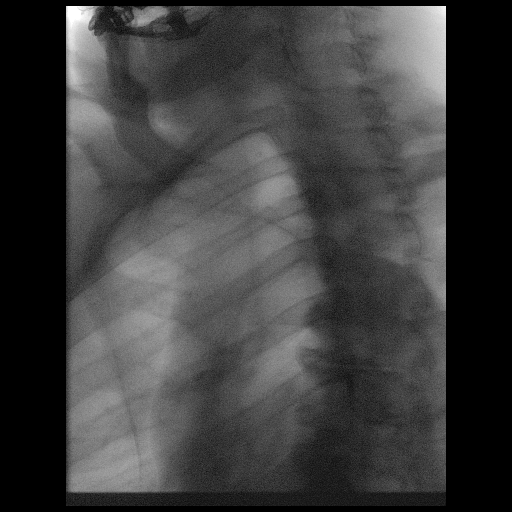
[frame 17/32]
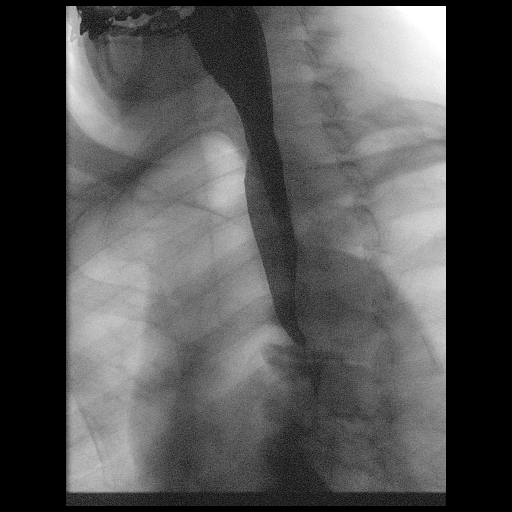
[frame 28/32]
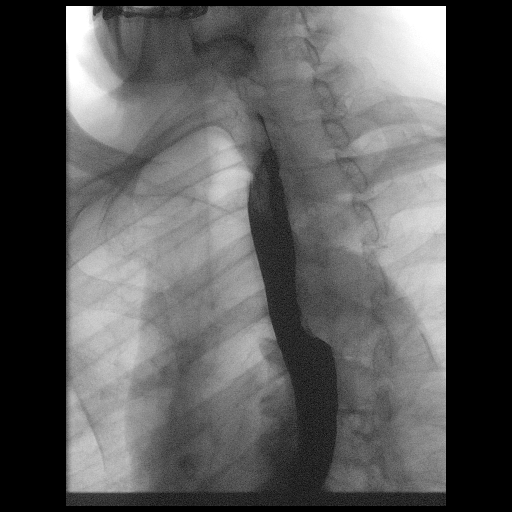

[Series 3: fluoro_barium 2fps_bw · 0.19mm/px · 1 of 1 slices shown (2 of 4)]
[im 1/1]
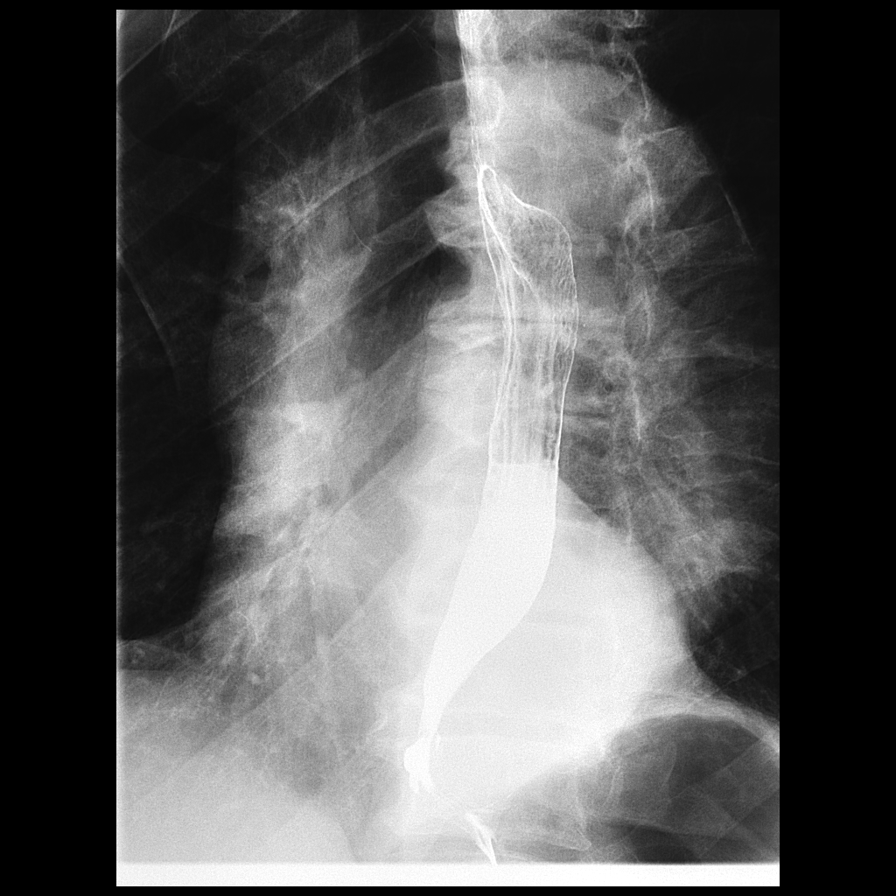

[Series 4: fluoro_barium 2fps_bw · 0.19mm/px · 1 of 1 slices shown (3 of 4)]
[im 1/1]
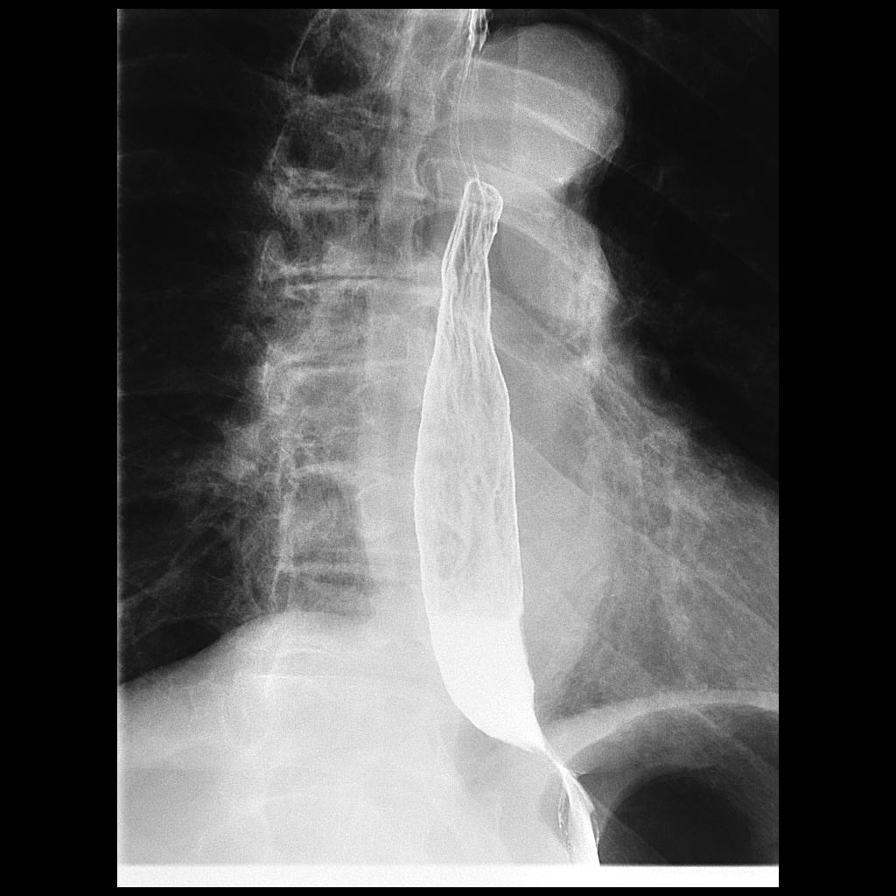

[Series 5: cp_standard · 0.41mm/px · 4 of 34 frames shown (2 of 2)]
[frame 6/34]
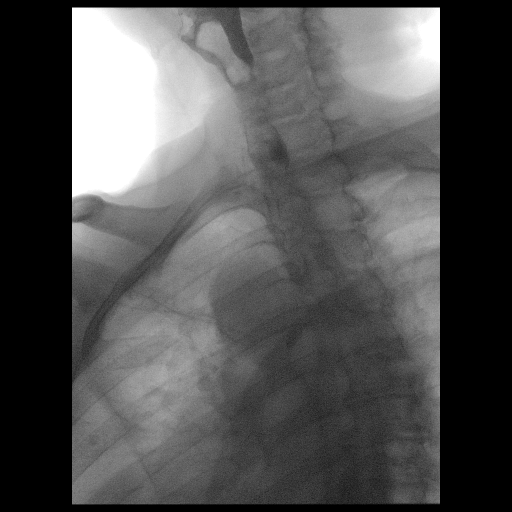
[frame 18/34]
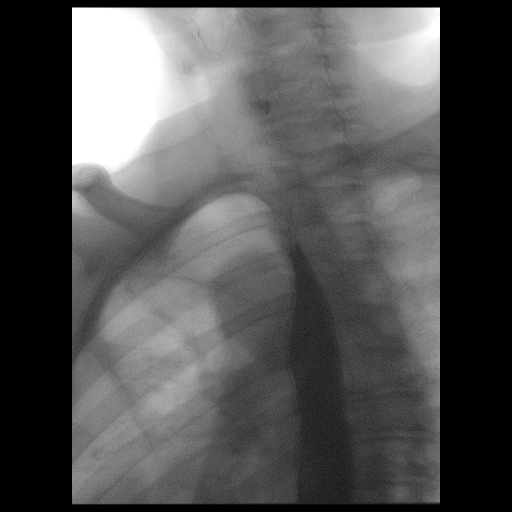
[frame 19/34]
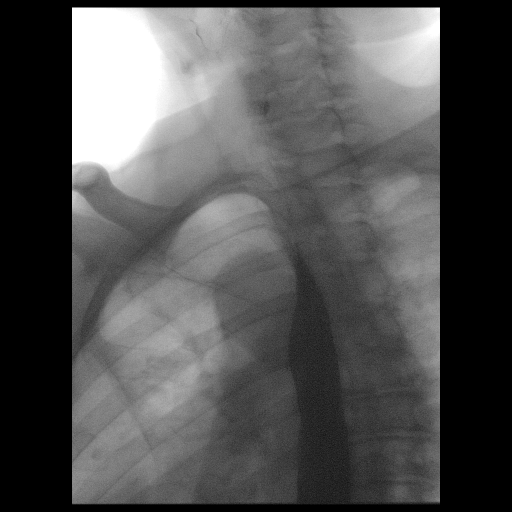
[frame 29/34]
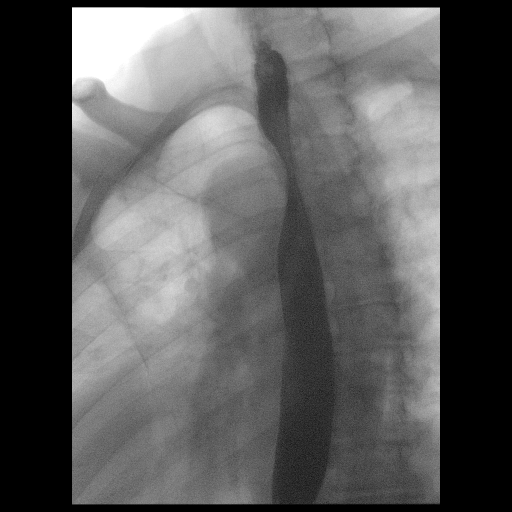

[Series 6: fluoro_barium 2fps_bw · 0.21mm/px · 1 of 1 slices shown (4 of 4)]
[im 1/1]
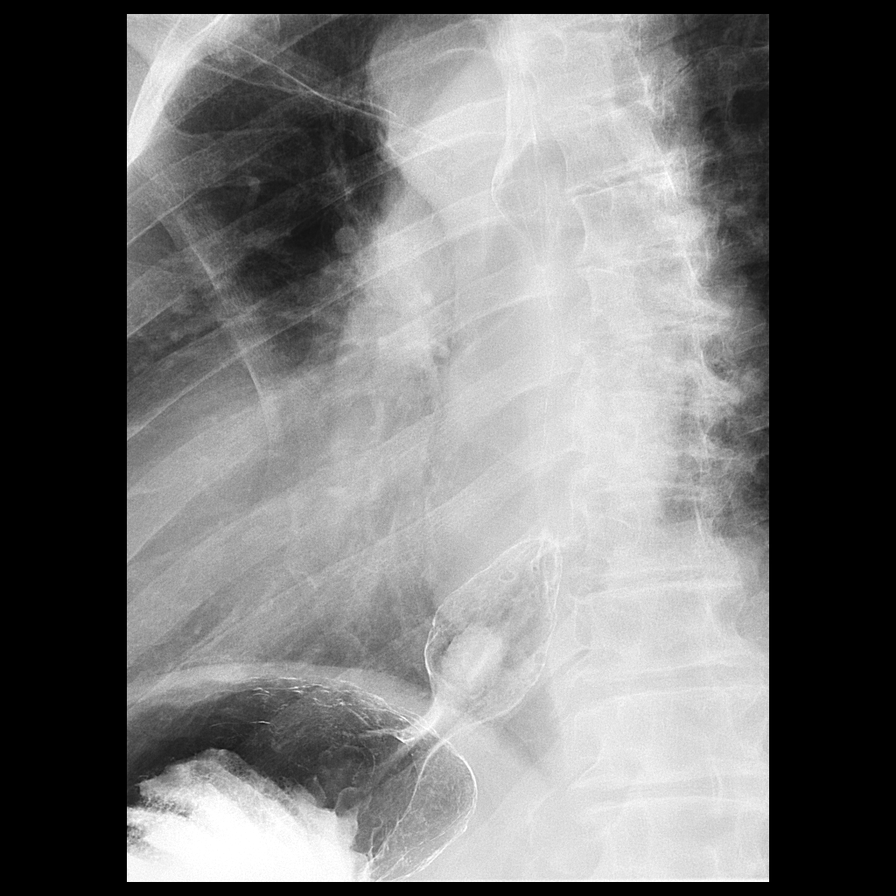

[12 of 12 positions shown; findings below may reference images not displayed]

FINDINGS: There is no mucosal irregularity within the thoracic esophagus or
distal esophagus. No swallowing dysfunction in the high cervical
esophagus. GE junction is normal. There is mild escape of the barium
bolus from the primary stripping wave. No tertiary contractions. 13
mm barium tab passed the GE junction easily. No gastroesophageal
reflux demonstrated during the course exam.
IMPRESSION: 1. Mild esophageal dysmotility.
2. No mucosal irregularity, stricture, or mass.

## 2017-03-01 DIAGNOSIS — I10 Essential (primary) hypertension: Secondary | ICD-10-CM | POA: Diagnosis not present

## 2017-03-01 DIAGNOSIS — E789 Disorder of lipoprotein metabolism, unspecified: Secondary | ICD-10-CM | POA: Diagnosis not present

## 2017-03-08 DIAGNOSIS — E291 Testicular hypofunction: Secondary | ICD-10-CM | POA: Diagnosis not present

## 2017-03-08 DIAGNOSIS — E789 Disorder of lipoprotein metabolism, unspecified: Secondary | ICD-10-CM | POA: Diagnosis not present

## 2017-09-04 ENCOUNTER — Inpatient Hospital Stay: Admission: RE | Admit: 2017-09-04 | Payer: Medicare Other | Source: Ambulatory Visit

## 2017-09-05 ENCOUNTER — Ambulatory Visit: Payer: Medicare Other | Admitting: Pulmonary Disease

## 2017-09-10 DIAGNOSIS — R739 Hyperglycemia, unspecified: Secondary | ICD-10-CM | POA: Diagnosis not present

## 2017-09-10 DIAGNOSIS — I10 Essential (primary) hypertension: Secondary | ICD-10-CM | POA: Diagnosis not present

## 2017-09-10 DIAGNOSIS — E041 Nontoxic single thyroid nodule: Secondary | ICD-10-CM | POA: Diagnosis not present

## 2017-09-10 DIAGNOSIS — J479 Bronchiectasis, uncomplicated: Secondary | ICD-10-CM | POA: Diagnosis not present

## 2017-09-10 DIAGNOSIS — R911 Solitary pulmonary nodule: Secondary | ICD-10-CM | POA: Diagnosis not present

## 2017-09-12 ENCOUNTER — Ambulatory Visit (INDEPENDENT_AMBULATORY_CARE_PROVIDER_SITE_OTHER)
Admission: RE | Admit: 2017-09-12 | Discharge: 2017-09-12 | Disposition: A | Discharge status: Home / Self Care | Payer: Medicare Other | Source: Ambulatory Visit | Attending: Adult Health | Admitting: Adult Health

## 2017-09-12 DIAGNOSIS — R918 Other nonspecific abnormal finding of lung field: Secondary | ICD-10-CM | POA: Diagnosis not present

## 2017-09-12 DIAGNOSIS — R911 Solitary pulmonary nodule: Secondary | ICD-10-CM

## 2017-10-08 ENCOUNTER — Ambulatory Visit: Payer: Medicare Other | Admitting: Pulmonary Disease

## 2017-10-08 ENCOUNTER — Encounter: Payer: Self-pay | Admitting: Pulmonary Disease

## 2017-10-08 VITALS — BP 136/82 | HR 86 | Ht 67.0 in | Wt 165.0 lb

## 2017-10-08 DIAGNOSIS — J479 Bronchiectasis, uncomplicated: Secondary | ICD-10-CM | POA: Diagnosis not present

## 2017-10-08 DIAGNOSIS — R918 Other nonspecific abnormal finding of lung field: Secondary | ICD-10-CM

## 2017-10-08 NOTE — Progress Notes (Signed)
[facility]  Chief Complaint  Patient presents with  . Follow-up    results from CT scan    Vital signs: BP 136/82 (BP Location: Left Arm, Cuff Size: Normal)   Pulse 86   Ht 5\' 7"  (1.702 m)   Wt 165 lb (74.8 kg)   SpO2 97%   BMI 25.84 kg/m   History of Present Illness: [name] is a 84 y.o. male former smoker with bronchiectasis and lung nodules.  He had f/u CT chest in December 2018.  No change in small nodules.  He feels well.  Has occasional cough with clear sputum.  Denies fever, chest pain, wheeze, dyspnea, hemoptysis, skin rash, joint swelling, dysphagia.  Still works as a Architect.  Eats 1 meal a day at noon, and has changed to organic foods.  Physical Exam:  General - pleasant Eyes - pupils reactive ENT - no sinus tenderness, no oral exudate, no LAN Cardiac - regular, no murmur Chest - no wheeze, rales Abd - soft, non tender Ext - no edema Skin - no rashes Neuro - normal strength Psych - normal mood   Assessment/Plan:  Lung nodules. - consistent with benign lesions - no additional follow up needed  Bronchiectasis. - no significant symptoms - monitor clinically   Patient Instructions  Follow up in 1 year    Nicholas Mires, MD [facility] 10/08/2017, 10:07 AM Pager:  [phone]  Flow Sheet  Pulmonary tests: CT chest 09/13/15 >> cylindrical BTX lower lobes, tree in bud, 3 mm LUL nodule, fluid in thoracic esophagus Esophagram 10/04/15 >> mild esophageal dysmotility PFT 11/17/15 >> FEV1 2.61 (120%), FEV1% 82, TLC 5.53 (85%), DLCO 65% CT chest 09/12/17 >> atherosclerosis, subpleural scarring, 2 mm nodule RUL, 3 mm noudle LUL, bronchial thickening, bronchiectasis, tree in bud nodularity   Past Medical History: He  has a past medical history of Arthritis, Bronchiectasis ([place]), and Lung nodules.  Past Surgical History: He  has a past surgical history that includes Joint replacement  (Left, 2008 ); Rotator cuff repair (Left, 1993); Ankle fracture surgery (Left, 40 YRS AGO); Total knee arthroplasty (06/18/2012); and Replacement total knee (Right).  Family History: His family history includes Heart Problems in his mother; Other in his father.  Social History: He  reports that he quit smoking about 58 years ago. His smoking use included cigarettes. He has a 20.00 pack-year smoking history. he has never used smokeless tobacco. He reports that he does not drink alcohol or use drugs.  Medications: Allergies as of 10/08/2017   No Known Allergies     Medication List    as of 10/08/2017 10:07 AM   You have not been prescribed any medications.

## 2017-10-08 NOTE — Patient Instructions (Signed)
Follow up in 1 year.

## 2017-10-31 ENCOUNTER — Other Ambulatory Visit: Payer: Self-pay | Admitting: General Surgery

## 2017-10-31 DIAGNOSIS — E041 Nontoxic single thyroid nodule: Secondary | ICD-10-CM

## 2017-11-06 ENCOUNTER — Ambulatory Visit
Admission: RE | Admit: 2017-11-06 | Discharge: 2017-11-06 | Disposition: A | Discharge status: Home / Self Care | Payer: Medicare Other | Source: Ambulatory Visit | Attending: General Surgery | Admitting: General Surgery

## 2017-11-06 DIAGNOSIS — E042 Nontoxic multinodular goiter: Secondary | ICD-10-CM | POA: Diagnosis not present

## 2017-11-06 DIAGNOSIS — E041 Nontoxic single thyroid nodule: Secondary | ICD-10-CM

## 2017-11-26 DIAGNOSIS — J479 Bronchiectasis, uncomplicated: Secondary | ICD-10-CM | POA: Diagnosis not present

## 2017-11-26 DIAGNOSIS — E042 Nontoxic multinodular goiter: Secondary | ICD-10-CM | POA: Diagnosis not present

## 2017-11-27 DIAGNOSIS — J449 Chronic obstructive pulmonary disease, unspecified: Secondary | ICD-10-CM | POA: Diagnosis not present

## 2017-11-27 DIAGNOSIS — J441 Chronic obstructive pulmonary disease with (acute) exacerbation: Secondary | ICD-10-CM | POA: Diagnosis not present

## 2017-11-27 DIAGNOSIS — I10 Essential (primary) hypertension: Secondary | ICD-10-CM | POA: Diagnosis not present

## 2017-11-27 DIAGNOSIS — R911 Solitary pulmonary nodule: Secondary | ICD-10-CM | POA: Diagnosis not present

## 2017-12-20 DIAGNOSIS — K5900 Constipation, unspecified: Secondary | ICD-10-CM | POA: Diagnosis not present

## 2018-04-29 DIAGNOSIS — R739 Hyperglycemia, unspecified: Secondary | ICD-10-CM | POA: Diagnosis not present

## 2018-04-29 DIAGNOSIS — I10 Essential (primary) hypertension: Secondary | ICD-10-CM | POA: Diagnosis not present

## 2018-04-29 DIAGNOSIS — E041 Nontoxic single thyroid nodule: Secondary | ICD-10-CM | POA: Diagnosis not present

## 2018-04-29 DIAGNOSIS — Z125 Encounter for screening for malignant neoplasm of prostate: Secondary | ICD-10-CM | POA: Diagnosis not present

## 2018-05-15 DIAGNOSIS — Z0000 Encounter for general adult medical examination without abnormal findings: Secondary | ICD-10-CM | POA: Diagnosis not present

## 2018-05-15 DIAGNOSIS — J479 Bronchiectasis, uncomplicated: Secondary | ICD-10-CM | POA: Diagnosis not present

## 2018-07-04 DIAGNOSIS — H353131 Nonexudative age-related macular degeneration, bilateral, early dry stage: Secondary | ICD-10-CM | POA: Diagnosis not present

## 2018-07-04 DIAGNOSIS — Z961 Presence of intraocular lens: Secondary | ICD-10-CM | POA: Diagnosis not present

## 2018-07-04 DIAGNOSIS — H25812 Combined forms of age-related cataract, left eye: Secondary | ICD-10-CM | POA: Diagnosis not present

## 2018-07-04 DIAGNOSIS — H04123 Dry eye syndrome of bilateral lacrimal glands: Secondary | ICD-10-CM | POA: Diagnosis not present

## 2018-08-12 DIAGNOSIS — J440 Chronic obstructive pulmonary disease with acute lower respiratory infection: Secondary | ICD-10-CM | POA: Diagnosis not present

## 2018-08-12 DIAGNOSIS — J479 Bronchiectasis, uncomplicated: Secondary | ICD-10-CM | POA: Diagnosis not present

## 2018-08-19 DIAGNOSIS — J479 Bronchiectasis, uncomplicated: Secondary | ICD-10-CM | POA: Diagnosis not present

## 2018-08-19 DIAGNOSIS — R531 Weakness: Secondary | ICD-10-CM | POA: Diagnosis not present

## 2018-08-19 DIAGNOSIS — D649 Anemia, unspecified: Secondary | ICD-10-CM | POA: Diagnosis not present

## 2018-08-19 DIAGNOSIS — R05 Cough: Secondary | ICD-10-CM | POA: Diagnosis not present

## 2019-05-14 DIAGNOSIS — R739 Hyperglycemia, unspecified: Secondary | ICD-10-CM | POA: Diagnosis not present

## 2019-05-14 DIAGNOSIS — E041 Nontoxic single thyroid nodule: Secondary | ICD-10-CM | POA: Diagnosis not present

## 2019-05-14 DIAGNOSIS — I10 Essential (primary) hypertension: Secondary | ICD-10-CM | POA: Diagnosis not present

## 2019-05-14 DIAGNOSIS — Z0000 Encounter for general adult medical examination without abnormal findings: Secondary | ICD-10-CM | POA: Diagnosis not present

## 2019-05-22 DIAGNOSIS — N183 Chronic kidney disease, stage 3 (moderate): Secondary | ICD-10-CM | POA: Diagnosis not present

## 2019-05-22 DIAGNOSIS — J449 Chronic obstructive pulmonary disease, unspecified: Secondary | ICD-10-CM | POA: Diagnosis not present

## 2019-05-22 DIAGNOSIS — I10 Essential (primary) hypertension: Secondary | ICD-10-CM | POA: Diagnosis not present

## 2019-05-22 DIAGNOSIS — Z0000 Encounter for general adult medical examination without abnormal findings: Secondary | ICD-10-CM | POA: Diagnosis not present

## 2019-05-22 DIAGNOSIS — J479 Bronchiectasis, uncomplicated: Secondary | ICD-10-CM | POA: Diagnosis not present

## 2019-05-22 DIAGNOSIS — N189 Chronic kidney disease, unspecified: Secondary | ICD-10-CM | POA: Diagnosis not present

## 2019-09-21 ENCOUNTER — Encounter (HOSPITAL_COMMUNITY): Payer: Self-pay | Admitting: Emergency Medicine

## 2019-09-21 ENCOUNTER — Emergency Department (HOSPITAL_COMMUNITY): Payer: Medicare Other

## 2019-09-21 ENCOUNTER — Emergency Department (HOSPITAL_COMMUNITY)
Admission: EM | Admit: 2019-09-21 | Discharge: 2019-09-21 | Disposition: A | Discharge status: Home / Self Care | Payer: Medicare Other | Attending: Emergency Medicine | Admitting: Emergency Medicine

## 2019-09-21 ENCOUNTER — Other Ambulatory Visit: Payer: Self-pay

## 2019-09-21 DIAGNOSIS — Z87891 Personal history of nicotine dependence: Secondary | ICD-10-CM | POA: Insufficient documentation

## 2019-09-21 DIAGNOSIS — R42 Dizziness and giddiness: Secondary | ICD-10-CM | POA: Diagnosis not present

## 2019-09-21 DIAGNOSIS — Z96652 Presence of left artificial knee joint: Secondary | ICD-10-CM | POA: Diagnosis not present

## 2019-09-21 DIAGNOSIS — I10 Essential (primary) hypertension: Secondary | ICD-10-CM | POA: Insufficient documentation

## 2019-09-21 DIAGNOSIS — Z96651 Presence of right artificial knee joint: Secondary | ICD-10-CM | POA: Diagnosis not present

## 2019-09-21 LAB — BASIC METABOLIC PANEL
Anion gap: 9 (ref 5–15)
BUN: 18 mg/dL (ref 8–23)
CO2: 24 mmol/L (ref 22–32)
Calcium: 8.9 mg/dL (ref 8.9–10.3)
Chloride: 102 mmol/L (ref 98–111)
Creatinine, Ser: 2.51 mg/dL — ABNORMAL HIGH (ref 0.61–1.24)
GFR calc Af Amer: 25 mL/min — ABNORMAL LOW (ref >60)
GFR calc non Af Amer: 22 mL/min — ABNORMAL LOW (ref >60)
Glucose, Bld: 106 mg/dL — ABNORMAL HIGH (ref 70–99)
Potassium: 4.9 mmol/L (ref 3.5–5.1)
Sodium: 135 mmol/L (ref 135–145)

## 2019-09-21 LAB — CBC
HCT: 35.4 % — ABNORMAL LOW (ref 39.0–52.0)
Hemoglobin: 11.9 g/dL — ABNORMAL LOW (ref 13.0–17.0)
MCH: 35.2 pg — ABNORMAL HIGH (ref 26.0–34.0)
MCHC: 33.6 g/dL (ref 30.0–36.0)
MCV: 104.7 fL — ABNORMAL HIGH (ref 80.0–100.0)
Platelets: 191 10*3/uL (ref 150–400)
RBC: 3.38 MIL/uL — ABNORMAL LOW (ref 4.22–5.81)
RDW: 13 % (ref 11.5–15.5)
WBC: 5.7 10*3/uL (ref 4.0–10.5)
nRBC: 0 % (ref 0.0–0.2)

## 2019-09-21 LAB — TROPONIN I (HIGH SENSITIVITY): Troponin I (High Sensitivity): 6 ng/L (ref <18)

## 2019-09-21 MED ORDER — SODIUM CHLORIDE 0.9% FLUSH: 3.0000 mL | Freq: Once | INTRAVENOUS | Status: DC

## 2019-09-21 MED ORDER — AMLODIPINE BESYLATE 5 MG PO TABS
5.0000 mg | ORAL_TABLET | Freq: Every day | ORAL | 0 refills | Status: AC
Start: 2019-09-21 — End: 2023-12-24

## 2019-09-21 NOTE — ED Notes (Signed)
Patient verbalizes understanding of discharge instructions. Opportunity for questioning and answers were provided. Armband removed by staff, pt discharged from ED. Pt. ambulatory and discharged home.  

## 2019-09-21 NOTE — Discharge Instructions (Addendum)
Start amlodipine as prescribed.  Call your primary care doctor tomorrow morning to find out what your latest kidney function was.  Creatinine here today was 2.5.  Please return to the ED if your symptoms worsen.

## 2019-09-21 NOTE — ED Triage Notes (Addendum)
Pt states today he felt dizzy and off balance. Denies any pain anywhere. Denies feeling dizzy or off balance currently. ambulated at triage without difficulty. Pt son reports that they went to [facility] and his Bp was reading 230s. No impaired gait at present.

## 2019-09-21 NOTE — ED Provider Notes (Signed)
[facility] EMERGENCY DEPARTMENT Provider Note   CSN: [id number] Arrival date & time: 09/21/19  1655     History Chief Complaint  Patient presents with  . Dizziness  . Hypertension    Nicholas Beasley is a 85 y.o. male.  The history is provided by the patient.  Hypertension This is a new problem. The current episode started 12 to 24 hours ago. The problem occurs rarely. The problem has not changed since onset.Pertinent negatives include no chest pain, no abdominal pain, no headaches and no shortness of breath. Nothing aggravates the symptoms. Nothing relieves the symptoms. He has tried nothing for the symptoms. The treatment provided no relief.       Past Medical History:  Diagnosis Date  . Arthritis   . Bronchiectasis ([place])   . Lung nodules     Patient Active Problem List   Diagnosis Date Noted  . Lung nodule 11/24/2015  . Bronchiectasis ([place]) 11/17/2015  . Hyponatremia 06/20/2012  . Acute blood loss anemia 06/20/2012  . Overweight (BMI 25.0-29.9) 06/19/2012  . S/P right TKA 06/18/2012    Past Surgical History:  Procedure Laterality Date  . ANKLE FRACTURE SURGERY Left 40 YRS AGO   LEFT  . JOINT REPLACEMENT Left 2008    L KNEE  . REPLACEMENT TOTAL KNEE Right   . ROTATOR CUFF REPAIR Left 1993   LEFT  . TOTAL KNEE ARTHROPLASTY  06/18/2012   Procedure: TOTAL KNEE ARTHROPLASTY;  Surgeon: [name], MD;  Location: [facility];  Service: Orthopedics;  Laterality: Right;       Family History  Problem Relation Age of Onset  . Heart Problems Mother        enlarged heart  . Other Father        brain tumor    Social History   Tobacco Use  . Smoking status: Former Smoker    Packs/day: 2.00    Years: 10.00    Pack years: 20.00    Types: Cigarettes    Quit date: 06/13/1959    Years since quitting: 60.3  . Smokeless tobacco: Never Used  Substance Use Topics  . Alcohol use: No    Alcohol/week: 0.0 standard drinks  . Drug use: No    Home  Medications Prior to Admission medications   Medication Sig Start Date End Date Taking? Authorizing Provider  amLODipine (NORVASC) 5 MG tablet Take 1 tablet (5 mg total) by mouth daily. 09/21/19 10/21/19  Lennice Sites, DO    Allergies    Patient has no known allergies.  Review of Systems   Review of Systems  Constitutional: Negative for chills and fever.  HENT: Negative for ear pain and sore throat.   Eyes: Negative for pain and visual disturbance.  Respiratory: Negative for cough and shortness of breath.   Cardiovascular: Negative for chest pain and palpitations.  Gastrointestinal: Negative for abdominal pain and vomiting.  Genitourinary: Negative for dysuria and hematuria.  Musculoskeletal: Negative for arthralgias and back pain.  Skin: Negative for color change and rash.  Neurological: Positive for dizziness (brief and resolved). Negative for seizures, syncope and headaches.  All other systems reviewed and are negative.   Physical Exam Updated Vital Signs  ED Triage Vitals  Enc Vitals Group     BP 09/21/19 1706 (!) 188/83     Pulse Rate 09/21/19 1706 69     Resp 09/21/19 1706 15     Temp 09/21/19 1706 97.9 F (36.6 C)     Temp  Source 09/21/19 1706 Oral     SpO2 09/21/19 1706 100 %     Weight --      Height --      Head Circumference --      Peak Flow --      Pain Score 09/21/19 1711 0     Pain Loc --      Pain Edu? --      Excl. in [place]? --     Physical Exam Vitals and nursing note reviewed.  Constitutional:      Appearance: He is well-developed.  HENT:     Head: Normocephalic and atraumatic.     Nose: Nose normal.     Mouth/Throat:     Mouth: Mucous membranes are moist.  Eyes:     Extraocular Movements: Extraocular movements intact.     Conjunctiva/sclera: Conjunctivae normal.     Pupils: Pupils are equal, round, and reactive to light.  Cardiovascular:     Rate and Rhythm: Normal rate and regular rhythm.     Heart sounds: No murmur.  Pulmonary:      Effort: Pulmonary effort is normal. No respiratory distress.     Breath sounds: Normal breath sounds.  Abdominal:     General: Abdomen is flat. There is no distension.     Palpations: Abdomen is soft.     Tenderness: There is no abdominal tenderness.  Musculoskeletal:        General: Normal range of motion.     Cervical back: Normal range of motion and neck supple.  Skin:    General: Skin is warm and dry.     Capillary Refill: Capillary refill takes less than 2 seconds.  Neurological:     General: No focal deficit present.     Mental Status: He is alert and oriented to person, place, and time.     Cranial Nerves: No cranial nerve deficit.     Sensory: No sensory deficit.     Motor: No weakness.     Coordination: Coordination normal.     Gait: Gait normal.     Deep Tendon Reflexes: Reflexes normal.  Psychiatric:        Mood and Affect: Mood normal.     ED Results / Procedures / Treatments   Labs (all labs ordered are listed, but only abnormal results are displayed) Labs Reviewed  BASIC METABOLIC PANEL - Abnormal; Notable for the following components:      Result Value   Glucose, Bld 106 (*)    Creatinine, Ser 2.51 (*)    GFR calc non Af Amer 22 (*)    GFR calc Af Amer 25 (*)    All other components within normal limits  CBC - Abnormal; Notable for the following components:   RBC 3.38 (*)    Hemoglobin 11.9 (*)    HCT 35.4 (*)    MCV 104.7 (*)    MCH 35.2 (*)    All other components within normal limits  TROPONIN I (HIGH SENSITIVITY)    EKG EKG Interpretation  Date/Time:  Sunday September 21 2019 17:14:43 EST Ventricular Rate:  64 PR Interval:  196 QRS Duration: 100 QT Interval:  406 QTC Calculation: 418 R Axis:   66 Text Interpretation: Normal sinus rhythm Normal ECG Confirmed by Lennice Sites [phone]) on 09/21/2019 8:07:04 PM   Radiology CT HEAD WO CONTRAST  Result Date: 09/21/2019 CLINICAL DATA:  85 year old male with dizziness. Possible stroke. EXAM:  CT HEAD WITHOUT CONTRAST TECHNIQUE: Contiguous axial images were  obtained from the base of the skull through the vertex without intravenous contrast. COMPARISON:  Head CT dated 05/04/2016. FINDINGS: Brain: There is moderate age-related atrophy and chronic microvascular ischemic changes. There is no acute intracranial hemorrhage. No mass effect or midline shift. No extra-axial fluid collection. Vascular: No hyperdense vessel or unexpected calcification. Skull: Normal. Negative for fracture or focal lesion. Sinuses/Orbits: There is diffuse mucoperiosteal thickening of paranasal sinuses. No air-fluid level. The mastoid air cells are clear. Other: None IMPRESSION: 1. No acute intracranial hemorrhage. 2. Moderate age-related atrophy and chronic microvascular ischemic changes. Electronically Signed   By: Anner Crete M.D.   On: 09/21/2019 18:18    Procedures Procedures (including critical care time)  Medications Ordered in ED Medications - No data to display  ED Course  I have reviewed the triage vital signs and the nursing notes.  Pertinent labs & imaging results that were available during my care of the patient were reviewed by me and considered in my medical decision making (see chart for details).    MDM Rules/Calculators/A&P  Nicholas Beasley is a 85 year old male with history of bronchiectasis, arthritis who presents to the ED with high blood pressure.  Patient with blood pressure 188/83 but otherwise normal vitals.  Blood pressure was in the 200s at a pharmacy today and he was told to come for evaluation.  Patient asymptomatic.  No chest pain, no shortness of breath.  Had brief dizziness this morning but none now.  Ambulatory.  Neurologically intact on exam.  No stroke symptoms.  Overall asymptomatic.  No history of hypertension.  Creatinine here is 2.5.  Last creatinine in our system is from 7 years ago and is 1.5.  He does have history of CKD.  Likely creatinine is around baseline.  Shared  decision was made to discharge patient home on amlodipine.  They will call primary care doctor and ask about kidney function on last laboratory in the morning and arrange follow-up.  Educated about blood pressure and given return precautions.  Otherwise lab work was unremarkable.  CT of the head was unremarkable and there is no hemorrhage.  EKG shows sinus rhythm.  No ischemic changes.  Troponin normal.  Doubt cardiac process.  Overall given reassurance.  This chart was dictated using voice recognition software.  Despite best efforts to proofread,  errors can occur which can change the documentation meaning.    Final Clinical Impression(s) / ED Diagnoses Final diagnoses:  Hypertension, unspecified type    Rx / DC Orders ED Discharge Orders         Ordered    amLODipine (NORVASC) 5 MG tablet  Daily     09/21/19 2031           Lennice Sites, DO 09/21/19 2032

## 2019-09-30 DIAGNOSIS — I10 Essential (primary) hypertension: Secondary | ICD-10-CM | POA: Diagnosis not present

## 2019-09-30 DIAGNOSIS — N189 Chronic kidney disease, unspecified: Secondary | ICD-10-CM | POA: Diagnosis not present

## 2020-03-22 DIAGNOSIS — D631 Anemia in chronic kidney disease: Secondary | ICD-10-CM | POA: Diagnosis not present

## 2020-03-22 DIAGNOSIS — Z0000 Encounter for general adult medical examination without abnormal findings: Secondary | ICD-10-CM | POA: Diagnosis not present

## 2020-03-22 DIAGNOSIS — I129 Hypertensive chronic kidney disease with stage 1 through stage 4 chronic kidney disease, or unspecified chronic kidney disease: Secondary | ICD-10-CM | POA: Diagnosis not present

## 2020-03-22 DIAGNOSIS — N184 Chronic kidney disease, stage 4 (severe): Secondary | ICD-10-CM | POA: Diagnosis not present

## 2020-03-22 DIAGNOSIS — J479 Bronchiectasis, uncomplicated: Secondary | ICD-10-CM | POA: Diagnosis not present

## 2020-03-22 DIAGNOSIS — N189 Chronic kidney disease, unspecified: Secondary | ICD-10-CM | POA: Diagnosis not present

## 2020-06-01 ENCOUNTER — Ambulatory Visit: Payer: Medicare Other

## 2020-06-22 ENCOUNTER — Ambulatory Visit: Payer: Medicare Other

## 2020-11-23 ENCOUNTER — Encounter (HOSPITAL_COMMUNITY): Payer: Self-pay | Admitting: Emergency Medicine

## 2021-05-27 DIAGNOSIS — J449 Chronic obstructive pulmonary disease, unspecified: Secondary | ICD-10-CM | POA: Diagnosis not present

## 2021-05-27 DIAGNOSIS — N184 Chronic kidney disease, stage 4 (severe): Secondary | ICD-10-CM | POA: Diagnosis not present

## 2021-05-27 DIAGNOSIS — H6120 Impacted cerumen, unspecified ear: Secondary | ICD-10-CM | POA: Diagnosis not present

## 2021-05-27 DIAGNOSIS — I129 Hypertensive chronic kidney disease with stage 1 through stage 4 chronic kidney disease, or unspecified chronic kidney disease: Secondary | ICD-10-CM | POA: Diagnosis not present

## 2021-05-27 DIAGNOSIS — D631 Anemia in chronic kidney disease: Secondary | ICD-10-CM | POA: Diagnosis not present

## 2021-05-27 DIAGNOSIS — Z0000 Encounter for general adult medical examination without abnormal findings: Secondary | ICD-10-CM | POA: Diagnosis not present

## 2022-05-31 DIAGNOSIS — I129 Hypertensive chronic kidney disease with stage 1 through stage 4 chronic kidney disease, or unspecified chronic kidney disease: Secondary | ICD-10-CM | POA: Diagnosis not present

## 2022-05-31 DIAGNOSIS — R634 Abnormal weight loss: Secondary | ICD-10-CM | POA: Diagnosis not present

## 2022-05-31 DIAGNOSIS — D631 Anemia in chronic kidney disease: Secondary | ICD-10-CM | POA: Diagnosis not present

## 2022-05-31 DIAGNOSIS — J449 Chronic obstructive pulmonary disease, unspecified: Secondary | ICD-10-CM | POA: Diagnosis not present

## 2022-05-31 DIAGNOSIS — Z0000 Encounter for general adult medical examination without abnormal findings: Secondary | ICD-10-CM | POA: Diagnosis not present

## 2022-05-31 DIAGNOSIS — N184 Chronic kidney disease, stage 4 (severe): Secondary | ICD-10-CM | POA: Diagnosis not present

## 2022-09-26 ENCOUNTER — Ambulatory Visit (HOSPITAL_COMMUNITY)
Admission: EM | Admit: 2022-09-26 | Discharge: 2022-09-26 | Disposition: A | Discharge status: Home / Self Care | Payer: Medicare Other

## 2022-09-27 ENCOUNTER — Ambulatory Visit (HOSPITAL_COMMUNITY)
Admission: EM | Admit: 2022-09-27 | Discharge: 2022-09-27 | Disposition: A | Discharge status: Home / Self Care | Payer: Medicare Other | Attending: Physician Assistant | Admitting: Physician Assistant

## 2022-09-27 ENCOUNTER — Ambulatory Visit (INDEPENDENT_AMBULATORY_CARE_PROVIDER_SITE_OTHER): Payer: Medicare Other

## 2022-09-27 ENCOUNTER — Encounter (HOSPITAL_COMMUNITY): Payer: Self-pay

## 2022-09-27 VITALS — BP 158/71 | HR 69 | Temp 98.2°F | Resp 16 | Ht 68.0 in | Wt 164.9 lb

## 2022-09-27 DIAGNOSIS — R059 Cough, unspecified: Secondary | ICD-10-CM | POA: Diagnosis not present

## 2022-09-27 DIAGNOSIS — J441 Chronic obstructive pulmonary disease with (acute) exacerbation: Secondary | ICD-10-CM | POA: Diagnosis not present

## 2022-09-27 MED ORDER — PREDNISONE 20 MG PO TABS: 20.0000 mg | ORAL_TABLET | Freq: Every day | ORAL | 0 refills | Status: AC

## 2022-09-27 MED ORDER — BENZONATATE 100 MG PO CAPS: 100.0000 mg | ORAL_CAPSULE | Freq: Three times a day (TID) | ORAL | 0 refills | Status: DC

## 2022-09-27 MED ORDER — DOXYCYCLINE HYCLATE 100 MG PO CAPS: 100.0000 mg | ORAL_CAPSULE | Freq: Two times a day (BID) | ORAL | 0 refills | Status: DC

## 2022-09-27 NOTE — ED Provider Notes (Signed)
[place]    CSN: [id number] Arrival date & time: 09/27/22  1041      History   Chief Complaint Chief Complaint  Patient presents with   Cough   Nasal Congestion    HPI Nicholas Beasley is a 89 y.o. male.   Patient presents today with a 3-week history of worsening cough that has become productive.  He reports that sputum has increased in amount and has become thicker and discolored.  Denies any fever, nausea, vomiting, weakness.  He has tried Robitussin without improvement of symptoms.  Denies any recent antibiotics or steroids.  Denies any known sick contacts.  He does have a history of COPD but does not take medication including albuterol inhaler on a regular basis.  He is a former smoker but quit many years ago.  He is eating and drinking normally.  Reports that cough does not interfere with his ability to perform daily activities including sleep at night.    Past Medical History:  Diagnosis Date   Arthritis    Bronchiectasis ([place])    COPD (chronic obstructive pulmonary disease) ([place])    Lung nodules     Patient Active Problem List   Diagnosis Date Noted   Lung nodule 11/24/2015   Bronchiectasis ([place]) 11/17/2015   Hyponatremia 06/20/2012   Acute blood loss anemia 06/20/2012   Overweight (BMI 25.0-29.9) 06/19/2012   S/P right TKA 06/18/2012    Past Surgical History:  Procedure Laterality Date   ANKLE FRACTURE SURGERY Left 40 YRS AGO   LEFT   JOINT REPLACEMENT Left 2008    L KNEE   REPLACEMENT TOTAL KNEE Right    ROTATOR CUFF REPAIR Left 1993   LEFT   TOTAL KNEE ARTHROPLASTY  06/18/2012   Procedure: TOTAL KNEE ARTHROPLASTY;  Surgeon: [name], MD;  Location: [facility];  Service: Orthopedics;  Laterality: Right;       Home Medications    Prior to Admission medications   Medication Sig Start Date End Date Taking? Authorizing Provider  amLODipine (NORVASC) 5 MG tablet Take 1 tablet (5 mg total) by mouth daily. 09/21/19 09/27/22 Yes Curatolo, Adam,  DO  benzonatate (TESSALON) 100 MG capsule Take 1 capsule (100 mg total) by mouth every 8 (eight) hours. 09/27/22  Yes Edina Winningham K, PA-C  doxycycline (VIBRAMYCIN) 100 MG capsule Take 1 capsule (100 mg total) by mouth 2 (two) times daily. 09/27/22  Yes Taz Vanness K, PA-C  predniSONE (DELTASONE) 20 MG tablet Take 1 tablet (20 mg total) by mouth daily for 5 days. 09/27/22 10/02/22 Yes Emori Kamau, Derry Skill, PA-C    Family History Family History  Problem Relation Age of Onset   Heart Problems Mother        enlarged heart   Other Father        brain tumor    Social History Social History   Tobacco Use   Smoking status: Former    Packs/day: 2.00    Years: 10.00    Total pack years: 20.00    Types: Cigarettes    Quit date: 06/13/1959    Years since quitting: 63.3   Smokeless tobacco: Never  Vaping Use   Vaping Use: Never used  Substance Use Topics   Alcohol use: No    Alcohol/week: 0.0 standard drinks of alcohol   Drug use: No     Allergies   Patient has no known allergies.   Review of Systems Review of Systems  Constitutional:  Positive for activity change.  Negative for appetite change, fatigue and fever.  HENT:  Positive for congestion. Negative for sinus pressure, sneezing and sore throat.   Respiratory:  Positive for cough. Negative for shortness of breath.   Cardiovascular:  Negative for chest pain.  Gastrointestinal:  Negative for abdominal pain, diarrhea, nausea and vomiting.  Neurological:  Negative for dizziness, light-headedness and headaches.     Physical Exam Triage Vital Signs ED Triage Vitals  Enc Vitals Group     BP 09/27/22 1105 (!) 158/71     Pulse Rate 09/27/22 1105 77     Resp 09/27/22 1105 16     Temp 09/27/22 1105 98.2 F (36.8 C)     Temp Source 09/27/22 1105 Oral     SpO2 09/27/22 1105 93 %     Weight 09/27/22 1108 164 lb 14.5 oz (74.8 kg)     Height 09/27/22 1108 5\' 8"  (1.727 m)     Head Circumference --      Peak Flow --      Pain Score 09/27/22  1104 0     Pain Loc --      Pain Edu? --      Excl. in [place]? --    No data found.  Updated Vital Signs BP (!) 158/71 (BP Location: Right Arm)   Pulse 69   Temp 98.2 F (36.8 C) (Oral)   Resp 16   Ht 5\' 8"  (1.727 m)   Wt 164 lb 14.5 oz (74.8 kg)   SpO2 97%   BMI 25.07 kg/m   Visual Acuity Right Eye Distance:   Left Eye Distance:   Bilateral Distance:    Right Eye Near:   Left Eye Near:    Bilateral Near:     Physical Exam Vitals reviewed.  Constitutional:      General: He is awake.     Appearance: Normal appearance. He is well-developed. He is not ill-appearing.     Comments: Very pleasant male appears stated age in no acute distress sitting comfortably in exam room  HENT:     Head: Normocephalic and atraumatic.     Right Ear: Tympanic membrane, ear canal and external ear normal. Tympanic membrane is not erythematous or bulging.     Left Ear: Tympanic membrane, ear canal and external ear normal. There is impacted cerumen. Tympanic membrane is not erythematous or bulging.     Nose: Nose normal.     Mouth/Throat:     Pharynx: Uvula midline. No oropharyngeal exudate or posterior oropharyngeal erythema.  Cardiovascular:     Rate and Rhythm: Normal rate and regular rhythm.     Heart sounds: Normal heart sounds, S1 normal and S2 normal. No murmur heard. Pulmonary:     Effort: Pulmonary effort is normal. No accessory muscle usage or respiratory distress.     Breath sounds: No stridor. Examination of the left-lower field reveals rales. Rales present. No wheezing or rhonchi.  Neurological:     Mental Status: He is alert.  Psychiatric:        Behavior: Behavior is cooperative.      UC Treatments / Results  Labs (all labs ordered are listed, but only abnormal results are displayed) Labs Reviewed - No data to display  EKG   Radiology DG Chest 2 View  Result Date: 09/27/2022 CLINICAL DATA:  Productive cough. EXAM: CHEST - 2 VIEW COMPARISON:  September 20, 2006.  FINDINGS: The heart size and mediastinal contours are within normal limits. Both lungs are clear. The visualized skeletal  structures are unremarkable. IMPRESSION: No active cardiopulmonary disease. Electronically Signed   By: Marijo Conception M.D.   On: 09/27/2022 11:47    Procedures Procedures (including critical care time)  Medications Ordered in UC Medications - No data to display  Initial Impression / Assessment and Plan / UC Course  I have reviewed the triage vital signs and the nursing notes.  Pertinent labs & imaging results that were available during my care of the patient were reviewed by me and considered in my medical decision making (see chart for details).     Patient is well-appearing, afebrile, nontoxic, nontachycardic.  Chest x-ray was obtained that showed no acute cardiopulmonary disease.  Patient was started on antibiotics to cover for secondary infection due to history of COPD and concern for COPD exacerbation.  His last metabolic panel in 1601 showed elevated creatinine at 2.51 so doxycycline was begun since we do not have to adjust the dose based on kidney function.  He was started on Tessalon to help with cough as well as prednisone burst of 20 mg for 5 days.  Discussed that he is not to take NSAIDs with this medication due to risk of GI bleeding.  Can use Mucinex, Flonase, Tylenol for additional symptom relief.  Recommended he follow-up with his primary care within the week.  If anything changes or worsens and the cough becomes more persistent, he develops shortness of breath, fever, nausea/vomiting, weakness he needs to be seen immediately.  Strict return precautions given to which he and family expressed understanding.  Final Clinical Impressions(s) / UC Diagnoses   Final diagnoses:  COPD exacerbation ([place])     Discharge Instructions      Start doxycycline 100 mg twice daily for 10 days.  Stay out of the sun while on this medication.  Take prednisone 20 mg for 5  days.  Do not take NSAIDs with this medication including aspirin, ibuprofen/Advil, naproxen/Aleve.  Use Tessalon for cough.  Follow-up with your primary care.  If anything changes or worsens and you have worsening cough, shortness of breath, weakness, nausea/vomiting interfere with oral intake you need to be seen immediately.     ED Prescriptions     Medication Sig Dispense Auth. Provider   doxycycline (VIBRAMYCIN) 100 MG capsule Take 1 capsule (100 mg total) by mouth 2 (two) times daily. 20 capsule Vanissa Strength K, PA-C   predniSONE (DELTASONE) 20 MG tablet Take 1 tablet (20 mg total) by mouth daily for 5 days. 5 tablet Neetu Carrozza K, PA-C   benzonatate (TESSALON) 100 MG capsule Take 1 capsule (100 mg total) by mouth every 8 (eight) hours. 21 capsule Smiley Birr K, PA-C      PDMP not reviewed this encounter.   Terrilee Croak, PA-C 09/27/22 1204

## 2022-09-27 NOTE — Discharge Instructions (Addendum)
Start doxycycline 100 mg twice daily for 10 days.  Stay out of the sun while on this medication.  Take prednisone 20 mg for 5 days.  Do not take NSAIDs with this medication including aspirin, ibuprofen/Advil, naproxen/Aleve.  Use Tessalon for cough.  Follow-up with your primary care.  If anything changes or worsens and you have worsening cough, shortness of breath, weakness, nausea/vomiting interfere with oral intake you need to be seen immediately.

## 2022-09-27 NOTE — ED Triage Notes (Addendum)
Chief Complaint: productive cough with green mucus. No pain with cough. Runny nose, no fever or body aches. Patient has COPD. Home COVID test was negative.   Onset: 3 weeks   Prescriptions or OTC medications tried: Yes- Robitussin     with little relief  Sick exposure: No  New foods, medications, or products: No  Recent Travel: No

## 2023-01-10 DIAGNOSIS — J449 Chronic obstructive pulmonary disease, unspecified: Secondary | ICD-10-CM | POA: Diagnosis not present

## 2023-01-10 DIAGNOSIS — I129 Hypertensive chronic kidney disease with stage 1 through stage 4 chronic kidney disease, or unspecified chronic kidney disease: Secondary | ICD-10-CM | POA: Diagnosis not present

## 2023-01-10 DIAGNOSIS — D631 Anemia in chronic kidney disease: Secondary | ICD-10-CM | POA: Diagnosis not present

## 2023-01-10 DIAGNOSIS — R634 Abnormal weight loss: Secondary | ICD-10-CM | POA: Diagnosis not present

## 2023-04-04 DIAGNOSIS — Z96653 Presence of artificial knee joint, bilateral: Secondary | ICD-10-CM | POA: Diagnosis not present

## 2023-04-04 DIAGNOSIS — M25562 Pain in left knee: Secondary | ICD-10-CM | POA: Diagnosis not present

## 2023-07-20 DIAGNOSIS — D631 Anemia in chronic kidney disease: Secondary | ICD-10-CM | POA: Diagnosis not present

## 2023-07-20 DIAGNOSIS — I129 Hypertensive chronic kidney disease with stage 1 through stage 4 chronic kidney disease, or unspecified chronic kidney disease: Secondary | ICD-10-CM | POA: Diagnosis not present

## 2023-07-20 DIAGNOSIS — Z0000 Encounter for general adult medical examination without abnormal findings: Secondary | ICD-10-CM | POA: Diagnosis not present

## 2023-07-20 DIAGNOSIS — J449 Chronic obstructive pulmonary disease, unspecified: Secondary | ICD-10-CM | POA: Diagnosis not present

## 2023-12-24 ENCOUNTER — Emergency Department (HOSPITAL_COMMUNITY)

## 2023-12-24 ENCOUNTER — Inpatient Hospital Stay (HOSPITAL_COMMUNITY)
Admission: EM | Admit: 2023-12-24 | Discharge: 2024-01-24 | DRG: 951 | Disposition: E | Attending: Student | Admitting: Student

## 2023-12-24 ENCOUNTER — Encounter (HOSPITAL_COMMUNITY): Payer: Self-pay

## 2023-12-24 ENCOUNTER — Other Ambulatory Visit: Payer: Self-pay

## 2023-12-24 DIAGNOSIS — Z515 Encounter for palliative care: Secondary | ICD-10-CM | POA: Diagnosis not present

## 2023-12-24 DIAGNOSIS — I120 Hypertensive chronic kidney disease with stage 5 chronic kidney disease or end stage renal disease: Secondary | ICD-10-CM | POA: Diagnosis present

## 2023-12-24 DIAGNOSIS — E875 Hyperkalemia: Principal | ICD-10-CM | POA: Insufficient documentation

## 2023-12-24 DIAGNOSIS — N186 End stage renal disease: Secondary | ICD-10-CM | POA: Diagnosis present

## 2023-12-24 DIAGNOSIS — E872 Acidosis, unspecified: Secondary | ICD-10-CM | POA: Diagnosis not present

## 2023-12-24 DIAGNOSIS — I959 Hypotension, unspecified: Secondary | ICD-10-CM | POA: Diagnosis not present

## 2023-12-24 DIAGNOSIS — N184 Chronic kidney disease, stage 4 (severe): Secondary | ICD-10-CM | POA: Diagnosis not present

## 2023-12-24 DIAGNOSIS — I10 Essential (primary) hypertension: Secondary | ICD-10-CM | POA: Diagnosis not present

## 2023-12-24 DIAGNOSIS — I700 Atherosclerosis of aorta: Secondary | ICD-10-CM | POA: Diagnosis not present

## 2023-12-24 DIAGNOSIS — J479 Bronchiectasis, uncomplicated: Secondary | ICD-10-CM | POA: Diagnosis present

## 2023-12-24 DIAGNOSIS — Z66 Do not resuscitate: Secondary | ICD-10-CM | POA: Diagnosis not present

## 2023-12-24 DIAGNOSIS — N179 Acute kidney failure, unspecified: Secondary | ICD-10-CM

## 2023-12-24 DIAGNOSIS — J449 Chronic obstructive pulmonary disease, unspecified: Secondary | ICD-10-CM | POA: Diagnosis not present

## 2023-12-24 DIAGNOSIS — Z96651 Presence of right artificial knee joint: Secondary | ICD-10-CM | POA: Diagnosis not present

## 2023-12-24 DIAGNOSIS — Z8489 Family history of other specified conditions: Secondary | ICD-10-CM

## 2023-12-24 DIAGNOSIS — G9349 Other encephalopathy: Secondary | ICD-10-CM | POA: Diagnosis not present

## 2023-12-24 DIAGNOSIS — Z87891 Personal history of nicotine dependence: Secondary | ICD-10-CM | POA: Diagnosis not present

## 2023-12-24 DIAGNOSIS — Z8249 Family history of ischemic heart disease and other diseases of the circulatory system: Secondary | ICD-10-CM | POA: Diagnosis not present

## 2023-12-24 DIAGNOSIS — D539 Nutritional anemia, unspecified: Secondary | ICD-10-CM | POA: Insufficient documentation

## 2023-12-24 DIAGNOSIS — E860 Dehydration: Secondary | ICD-10-CM | POA: Diagnosis not present

## 2023-12-24 DIAGNOSIS — A419 Sepsis, unspecified organism: Secondary | ICD-10-CM

## 2023-12-24 DIAGNOSIS — R680 Hypothermia, not associated with low environmental temperature: Secondary | ICD-10-CM | POA: Diagnosis present

## 2023-12-24 DIAGNOSIS — Z0389 Encounter for observation for other suspected diseases and conditions ruled out: Secondary | ICD-10-CM | POA: Diagnosis not present

## 2023-12-24 DIAGNOSIS — Z1152 Encounter for screening for COVID-19: Secondary | ICD-10-CM | POA: Diagnosis not present

## 2023-12-24 DIAGNOSIS — R54 Age-related physical debility: Secondary | ICD-10-CM | POA: Diagnosis not present

## 2023-12-24 DIAGNOSIS — T68XXXA Hypothermia, initial encounter: Secondary | ICD-10-CM | POA: Diagnosis not present

## 2023-12-24 DIAGNOSIS — Z79899 Other long term (current) drug therapy: Secondary | ICD-10-CM

## 2023-12-24 DIAGNOSIS — Z96652 Presence of left artificial knee joint: Secondary | ICD-10-CM | POA: Diagnosis present

## 2023-12-24 DIAGNOSIS — N19 Unspecified kidney failure: Secondary | ICD-10-CM | POA: Insufficient documentation

## 2023-12-24 DIAGNOSIS — R404 Transient alteration of awareness: Secondary | ICD-10-CM | POA: Diagnosis not present

## 2023-12-24 DIAGNOSIS — R0602 Shortness of breath: Secondary | ICD-10-CM | POA: Diagnosis not present

## 2023-12-24 DIAGNOSIS — D689 Coagulation defect, unspecified: Secondary | ICD-10-CM | POA: Insufficient documentation

## 2023-12-24 DIAGNOSIS — R6520 Severe sepsis without septic shock: Secondary | ICD-10-CM | POA: Diagnosis not present

## 2023-12-24 LAB — RESP PANEL BY RT-PCR (RSV, FLU A&B, COVID)  RVPGX2
Influenza A by PCR: NEGATIVE
Influenza B by PCR: NEGATIVE
Resp Syncytial Virus by PCR: NEGATIVE
SARS Coronavirus 2 by RT PCR: NEGATIVE

## 2023-12-24 LAB — I-STAT CG4 LACTIC ACID, ED
Lactic Acid, Venous: 9.8 mmol/L (ref 0.5–1.9)
Lactic Acid, Venous: 10.4 mmol/L (ref 0.5–1.9)

## 2023-12-24 LAB — COMPREHENSIVE METABOLIC PANEL WITH GFR
ALT: 13 U/L (ref 0–44)
AST: 45 U/L — ABNORMAL HIGH (ref 15–41)
Albumin: 3.4 g/dL — ABNORMAL LOW (ref 3.5–5.0)
Alkaline Phosphatase: 468 U/L — ABNORMAL HIGH (ref 38–126)
BUN: 170 mg/dL — ABNORMAL HIGH (ref 8–23)
CO2: <7 mmol/L — ABNORMAL LOW (ref 22–32)
Calcium: 7.9 mg/dL — ABNORMAL LOW (ref 8.9–10.3)
Chloride: 108 mmol/L (ref 98–111)
Creatinine, Ser: 14.77 mg/dL — ABNORMAL HIGH (ref 0.61–1.24)
GFR, Estimated: 3 mL/min — ABNORMAL LOW (ref >60)
Glucose, Bld: 98 mg/dL (ref 70–99)
Potassium: >7.5 mmol/L (ref 3.5–5.1)
Sodium: 140 mmol/L (ref 135–145)
Total Bilirubin: 1 mg/dL (ref 0.0–1.2)
Total Protein: 7.6 g/dL (ref 6.5–8.1)

## 2023-12-24 LAB — CBC WITH DIFFERENTIAL/PLATELET
Abs Immature Granulocytes: 0.1 10*3/uL — ABNORMAL HIGH (ref 0.00–0.07)
Basophils Absolute: 0 10*3/uL (ref 0.0–0.1)
Basophils Relative: 0 %
Eosinophils Absolute: 0 10*3/uL (ref 0.0–0.5)
Eosinophils Relative: 0 %
HCT: 32.1 % — ABNORMAL LOW (ref 39.0–52.0)
Hemoglobin: 10 g/dL — ABNORMAL LOW (ref 13.0–17.0)
Immature Granulocytes: 1 %
Lymphocytes Relative: 6 %
Lymphs Abs: 0.6 10*3/uL — ABNORMAL LOW (ref 0.7–4.0)
MCH: 34.1 pg — ABNORMAL HIGH (ref 26.0–34.0)
MCHC: 31.2 g/dL (ref 30.0–36.0)
MCV: 109.6 fL — ABNORMAL HIGH (ref 80.0–100.0)
Monocytes Absolute: 0.4 10*3/uL (ref 0.1–1.0)
Monocytes Relative: 4 %
Neutro Abs: 9.3 10*3/uL — ABNORMAL HIGH (ref 1.7–7.7)
Neutrophils Relative %: 89 %
Platelets: 188 10*3/uL (ref 150–400)
RBC: 2.93 MIL/uL — ABNORMAL LOW (ref 4.22–5.81)
RDW: 13.1 % (ref 11.5–15.5)
WBC: 10.4 10*3/uL (ref 4.0–10.5)
nRBC: 0.5 % — ABNORMAL HIGH (ref 0.0–0.2)

## 2023-12-24 LAB — URINALYSIS, W/ REFLEX TO CULTURE (INFECTION SUSPECTED)
Bilirubin Urine: NEGATIVE
Glucose, UA: NEGATIVE mg/dL
Ketones, ur: NEGATIVE mg/dL
Nitrite: NEGATIVE
Protein, ur: 30 mg/dL — AB
Specific Gravity, Urine: 1.011 (ref 1.005–1.030)
pH: 5 (ref 5.0–8.0)

## 2023-12-24 LAB — APTT: aPTT: 39 s — ABNORMAL HIGH (ref 24–36)

## 2023-12-24 LAB — CBG MONITORING, ED: Glucose-Capillary: 73 mg/dL (ref 70–99)

## 2023-12-24 LAB — PROTIME-INR
INR: 1.7 — ABNORMAL HIGH (ref 0.8–1.2)
Prothrombin Time: 20.6 s — ABNORMAL HIGH (ref 11.4–15.2)

## 2023-12-24 MED ORDER — LORAZEPAM 2 MG/ML IJ SOLN
1.0000 mg | INTRAMUSCULAR | Status: DC | PRN
  Filled 2023-12-24: qty 1

## 2023-12-24 MED ORDER — ALBUTEROL SULFATE (2.5 MG/3ML) 0.083% IN NEBU: 2.5000 mg | INHALATION_SOLUTION | RESPIRATORY_TRACT | Status: DC | PRN

## 2023-12-24 MED ORDER — HALOPERIDOL 1 MG PO TABS: 0.5000 mg | ORAL_TABLET | ORAL | Status: DC | PRN

## 2023-12-24 MED ORDER — SODIUM CHLORIDE 0.9% FLUSH: 3.0000 mL | INTRAVENOUS | Status: DC | PRN

## 2023-12-24 MED ORDER — SODIUM CHLORIDE 0.9% FLUSH
3.0000 mL | Freq: Two times a day (BID) | INTRAVENOUS | Status: DC
  Administered 2023-12-24: 3 mL via INTRAVENOUS

## 2023-12-24 MED ORDER — LACTATED RINGERS IV SOLN: INTRAVENOUS | Status: DC

## 2023-12-24 MED ORDER — GLYCOPYRROLATE 1 MG PO TABS: 1.0000 mg | ORAL_TABLET | ORAL | Status: DC | PRN

## 2023-12-24 MED ORDER — HALOPERIDOL LACTATE 2 MG/ML PO CONC: 0.5000 mg | ORAL | Status: DC | PRN

## 2023-12-24 MED ORDER — VANCOMYCIN HCL IN DEXTROSE 1-5 GM/200ML-% IV SOLN
1000.0000 mg | Freq: Once | INTRAVENOUS | Status: DC
  Administered 2023-12-24: 1000 mg via INTRAVENOUS
  Filled 2023-12-24: qty 200

## 2023-12-24 MED ORDER — HYDROMORPHONE BOLUS VIA INFUSION
1.0000 mg | INTRAVENOUS | Status: DC | PRN
  Administered 2023-12-24 (×4): 1 mg via INTRAVENOUS

## 2023-12-24 MED ORDER — SODIUM BICARBONATE 8.4 % IV SOLN
Freq: Once | INTRAVENOUS | Status: DC
  Filled 2023-12-24 (×2): qty 1000

## 2023-12-24 MED ORDER — SODIUM CHLORIDE 0.9 % IV SOLN: 250.0000 mL | INTRAVENOUS | Status: DC | PRN

## 2023-12-24 MED ORDER — LORAZEPAM 2 MG/ML PO CONC: 1.0000 mg | ORAL | Status: DC | PRN

## 2023-12-24 MED ORDER — GLYCOPYRROLATE 0.2 MG/ML IJ SOLN: 0.2000 mg | INTRAMUSCULAR | Status: DC | PRN

## 2023-12-24 MED ORDER — LACTATED RINGERS IV BOLUS (SEPSIS)
250.0000 mL | Freq: Once | INTRAVENOUS | Status: AC
  Administered 2023-12-24: 250 mL via INTRAVENOUS

## 2023-12-24 MED ORDER — ONDANSETRON HCL 4 MG/2ML IJ SOLN: 4.0000 mg | Freq: Four times a day (QID) | INTRAMUSCULAR | Status: DC | PRN

## 2023-12-24 MED ORDER — ACETAMINOPHEN 650 MG RE SUPP: 650.0000 mg | Freq: Four times a day (QID) | RECTAL | Status: DC | PRN

## 2023-12-24 MED ORDER — SODIUM CHLORIDE 0.9 % IV SOLN
1.0000 mg/h | INTRAVENOUS | Status: DC
  Administered 2023-12-24 – 2023-12-25 (×2): 1 mg/h via INTRAVENOUS
  Filled 2023-12-24 (×2): qty 5

## 2023-12-24 MED ORDER — ACETAMINOPHEN 325 MG PO TABS: 650.0000 mg | ORAL_TABLET | Freq: Four times a day (QID) | ORAL | Status: DC | PRN

## 2023-12-24 MED ORDER — SODIUM CHLORIDE 0.9 % IV SOLN
2.0000 g | Freq: Once | INTRAVENOUS | Status: AC
  Administered 2023-12-24: 2 g via INTRAVENOUS
  Filled 2023-12-24: qty 12.5

## 2023-12-24 MED ORDER — DIPHENHYDRAMINE HCL 50 MG/ML IJ SOLN: 12.5000 mg | INTRAMUSCULAR | Status: DC | PRN

## 2023-12-24 MED ORDER — ALBUTEROL SULFATE (2.5 MG/3ML) 0.083% IN NEBU
10.0000 mg | INHALATION_SOLUTION | Freq: Once | RESPIRATORY_TRACT | Status: AC
  Administered 2023-12-24: 10 mg via RESPIRATORY_TRACT
  Filled 2023-12-24: qty 12

## 2023-12-24 MED ORDER — ONDANSETRON 4 MG PO TBDP: 4.0000 mg | ORAL_TABLET | Freq: Four times a day (QID) | ORAL | Status: DC | PRN

## 2023-12-24 MED ORDER — LORAZEPAM 2 MG/ML IJ SOLN
1.0000 mg | INTRAMUSCULAR | Status: DC | PRN
  Administered 2023-12-24: 1 mg via INTRAVENOUS

## 2023-12-24 MED ORDER — SODIUM CHLORIDE 0.9 % IV BOLUS
1000.0000 mL | Freq: Once | INTRAVENOUS | Status: AC
Start: 2023-12-24 — End: 2023-12-24
  Administered 2023-12-24: 1000 mL via INTRAVENOUS

## 2023-12-24 MED ORDER — LACTATED RINGERS IV BOLUS (SEPSIS)
1000.0000 mL | Freq: Once | INTRAVENOUS | Status: AC
  Administered 2023-12-24: 1000 mL via INTRAVENOUS

## 2023-12-24 MED ORDER — HALOPERIDOL LACTATE 5 MG/ML IJ SOLN
0.5000 mg | INTRAMUSCULAR | Status: DC | PRN
  Filled 2023-12-24: qty 1

## 2023-12-24 MED ORDER — GLYCOPYRROLATE 0.2 MG/ML IJ SOLN
0.2000 mg | INTRAMUSCULAR | Status: DC | PRN
  Administered 2023-12-24: 0.2 mg via INTRAVENOUS
  Filled 2023-12-24: qty 1

## 2023-12-24 MED ORDER — METRONIDAZOLE 500 MG/100ML IV SOLN
500.0000 mg | Freq: Once | INTRAVENOUS | Status: AC
  Administered 2023-12-24: 500 mg via INTRAVENOUS
  Filled 2023-12-24: qty 100

## 2023-12-24 MED ORDER — CALCIUM GLUCONATE 10 % IV SOLN
1.0000 g | Freq: Once | INTRAVENOUS | Status: AC
  Administered 2023-12-24: 1 g via INTRAVENOUS
  Filled 2023-12-24: qty 10

## 2023-12-24 MED ORDER — HYDROMORPHONE HCL-NACL 50-0.9 MG/50ML-% IV SOLN
1.0000 mg/h | INTRAVENOUS | Status: DC
  Filled 2023-12-24: qty 50

## 2023-12-24 MED ORDER — LORAZEPAM 1 MG PO TABS: 1.0000 mg | ORAL_TABLET | ORAL | Status: DC | PRN

## 2023-12-24 NOTE — Plan of Care (Signed)
  Problem: Education: Goal: Knowledge of the prescribed therapeutic regimen will improve Outcome: Progressing   Problem: Coping: Goal: Ability to identify and develop effective coping behavior will improve Outcome: Progressing   Problem: Clinical Measurements: Goal: Quality of life will improve Outcome: Progressing   Problem: Respiratory: Goal: Verbalizations of increased ease of respirations will increase Outcome: Progressing   Problem: Role Relationship: Goal: Family's ability to cope with current situation will improve Outcome: Progressing   Problem: Pain Management: Goal: Satisfaction with pain management regimen will improve Outcome: Progressing   Problem: Clinical Measurements: Goal: Respiratory complications will improve Outcome: Progressing Goal: Cardiovascular complication will be avoided Outcome: Progressing   Problem: Coping: Goal: Level of anxiety will decrease Outcome: Progressing   Problem: Elimination: Goal: Will not experience complications related to urinary retention Outcome: Progressing   Problem: Pain Managment: Goal: General experience of comfort will improve and/or be controlled Outcome: Progressing   Problem: Safety: Goal: Ability to remain free from injury will improve Outcome: Progressing

## 2023-12-24 NOTE — Progress Notes (Signed)
 Nephrology Documentation Note:   Called by ER for consultation of 53M CKD 4, HTN who presented today after being found down on background of several weeks of weakness and loose stools.  VS show Temp ~90 degrees F, BP as low as 60s > 80s after volume, RR high 20s.  Labs with K > 7.5 Bicarb < 7, BUN 170, Cr 15, lactate > 10.  Primary has discussed with family and they do not wish heroics.  Agree labs incompatible with life and even with heroic measures he is unlikely to survive.  Agree with primary rec comfort care to family.   I'm available as needed for full consultation.    Estill Bakes MD [facility] Pager [phone]

## 2023-12-24 NOTE — ED Triage Notes (Signed)
 Patient BIB EMS after son found him laying on the floor this morning unresponsive. Initial BP 68/38. Last known well was 11 PM last night. Patient is SOB with some crackles noted by EMS in right lower lung.   CBS - 103 HR - 65 BP - 92/42

## 2023-12-24 NOTE — ED Provider Notes (Signed)
 [place] [facility] AT [facility] Provider Note   CSN: [id number] Arrival date & time: 12/24/23  0865     History  Chief Complaint  Patient presents with   Hypotension   Altered Mental Status    Nicholas Beasley is a 89 y.o. male.  HPI Patient was with family.  He was found on the floor this morning.  Unknown exactly how much downtime but reportedly was well at baseline as of yesterday.  On EMS arrival patient was placed on nonrebreather mask.  First documented blood pressure was systolics of 60.  At this time awaiting family for additional history.  Patient is awake but not giving history.  Patient's son reports that the patient has been slowing down for several days.  He reports he has been interacting verbally but speech has been slower.  He also reports he has been more unsteady than usual and his son brought him a walker to use.  Patient son reports that patient at baseline is a vegetarian and usually eats fruits and nuts etc.  He reports he was eating less recently.  Patient's son had gotten some Ensure for him but he had not been able to use it yet due to the fall last night.    Home Medications Prior to Admission medications   Medication Sig Start Date End Date Taking? Authorizing Provider  amLODipine (NORVASC) 5 MG tablet Take 1 tablet (5 mg total) by mouth daily. 09/21/19 12/24/23 Yes Curatolo, Adam, DO      Allergies    Patient has no known allergies.    Review of Systems   Review of Systems  Physical Exam Updated Vital Signs BP (!) 106/57   Pulse 69   Temp (!) 90.7 F (32.6 C)   Resp (!) 27   Ht 5\' 8"  (1.727 m)   Wt 63.5 kg Comment: Per family  SpO2 100%   BMI 21.29 kg/m  Physical Exam Constitutional:      Comments: Ill appearance.  Tachypnea but significant respiratory distress.  Patient is awake but confused.  HENT:     Head: Normocephalic and atraumatic.     Mouth/Throat:     Mouth: Mucous membranes are dry.     Pharynx:  Oropharynx is clear.  Eyes:     Extraocular Movements: Extraocular movements intact.  Cardiovascular:     Rate and Rhythm: Normal rate.  Pulmonary:     Comments: Tachypnea.  Lungs are grossly clear.  Patient has adequate airflow to the bases.  Rare crackle. Abdominal:     Comments: Abdomen is soft and nondistended.  Musculoskeletal:     Comments: No appearance of extremity injuries or deformities.  Patient has 1-2+ pitting edema around the ankles and lower aspect of the legs but the calves do not have edema and are soft and pliable.  Feet and legs are in good condition without any wounds.  Skin:    General: Skin is warm and dry.  Neurological:     Comments: Patient is confused in appearance.  Eyes are open.  He is not following commands.  He does seem to have some withdrawal from pain with IV start.  No obvious focal flaccid deficits.  Patient has positive body tone.     ED Results / Procedures / Treatments   Labs (all labs ordered are listed, but only abnormal results are displayed) Labs Reviewed  COMPREHENSIVE METABOLIC PANEL WITH GFR - Abnormal; Notable for the following components:      Result  Value   Potassium >7.5 (*)    CO2 <7 (*)    BUN 170 (*)    Creatinine, Ser 14.77 (*)    Calcium 7.9 (*)    Albumin 3.4 (*)    AST 45 (*)    Alkaline Phosphatase 468 (*)    GFR, Estimated 3 (*)    All other components within normal limits  CBC WITH DIFFERENTIAL/PLATELET - Abnormal; Notable for the following components:   RBC 2.93 (*)    Hemoglobin 10.0 (*)    HCT 32.1 (*)    MCV 109.6 (*)    MCH 34.1 (*)    nRBC 0.5 (*)    Neutro Abs 9.3 (*)    Lymphs Abs 0.6 (*)    Abs Immature Granulocytes 0.10 (*)    All other components within normal limits  PROTIME-INR - Abnormal; Notable for the following components:   Prothrombin Time 20.6 (*)    INR 1.7 (*)    All other components within normal limits  APTT - Abnormal; Notable for the following components:   aPTT 39 (*)    All other  components within normal limits  URINALYSIS, W/ REFLEX TO CULTURE (INFECTION SUSPECTED) - Abnormal; Notable for the following components:   APPearance HAZY (*)    Hgb urine dipstick MODERATE (*)    Protein, ur 30 (*)    Leukocytes,Ua MODERATE (*)    Bacteria, UA FEW (*)    All other components within normal limits  I-STAT CG4 LACTIC ACID, ED - Abnormal; Notable for the following components:   Lactic Acid, Venous 9.8 (*)    All other components within normal limits  I-STAT CG4 LACTIC ACID, ED - Abnormal; Notable for the following components:   Lactic Acid, Venous 10.4 (*)    All other components within normal limits  RESP PANEL BY RT-PCR (RSV, FLU A&B, COVID)  RVPGX2  CULTURE, BLOOD (ROUTINE X 2)  CULTURE, BLOOD (ROUTINE X 2)  I-STAT CHEM 8, ED  CBG MONITORING, ED  I-STAT VENOUS BLOOD GAS, ED    EKG EKG Interpretation Date/Time:  Monday December 24 2023 08:46:43 EDT Ventricular Rate:  63 PR Interval:  164 QRS Duration:  164 QT Interval:  513 QTC Calculation: 526 R Axis:   45  Text Interpretation: Sinus or ectopic atrial rhythm Right bundle branch block peaked t waves. no STEMI pattern Confirmed by Arby Barrette [phone]) on 12/24/2023 10:24:37 AM  Radiology DG Chest Port 1 View Result Date: 12/24/2023 CLINICAL DATA:  Concern for sepsis. EXAM: PORTABLE CHEST 1 VIEW COMPARISON:  Chest radiograph dated 09/27/2022. FINDINGS: The heart size and mediastinal contours are within normal limits. Aortic atherosclerosis. Mild biapical pleuroparenchymal scarring. Bibasilar bronchiectatic changes, similar to the prior exam. No focal consolidation, sizeable pleural effusion, or pneumothorax. No acute osseous abnormality. IMPRESSION: 1. No acute cardiopulmonary findings. 2. Bibasilar bronchiectasis, not significantly changed since the prior exam. Electronically Signed   By: Hart Robinsons M.D.   On: 12/24/2023 10:23    Procedures Procedures   CRITICAL CARE Performed by: Arby Barrette   Total critical care time: 60 minutes  Critical care time was exclusive of separately billable procedures and treating other patients.  Critical care was necessary to treat or prevent imminent or life-threatening deterioration.  Critical care was time spent personally by me on the following activities: development of treatment plan with patient and/or surrogate as well as nursing, discussions with consultants, evaluation of patient's response to treatment, examination of patient, obtaining history from patient or  surrogate, ordering and performing treatments and interventions, ordering and review of laboratory studies, ordering and review of radiographic studies, pulse oximetry and re-evaluation of patient's condition.  Medications Ordered in ED Medications  lactated ringers infusion ( Intravenous New Bag/Given 12/24/23 1032)  vancomycin (VANCOCIN) IVPB 1000 mg/200 mL premix (1,000 mg Intravenous New Bag/Given 12/24/23 1100)  sodium bicarbonate 150 mEq in dextrose 5 % 1,150 mL infusion ( Intravenous New Bag/Given 12/24/23 1131)  lactated ringers bolus 1,000 mL (0 mLs Intravenous Stopped 12/24/23 1025)    And  lactated ringers bolus 1,000 mL (0 mLs Intravenous Stopped 12/24/23 1025)    And  lactated ringers bolus 250 mL (0 mLs Intravenous Stopped 12/24/23 1110)  ceFEPIme (MAXIPIME) 2 g in sodium chloride 0.9 % 100 mL IVPB (0 g Intravenous Stopped 12/24/23 0951)  metroNIDAZOLE (FLAGYL) IVPB 500 mg (0 mg Intravenous Stopped 12/24/23 1056)  calcium gluconate inj 10% (1 g) URGENT USE ONLY! (1 g Intravenous Given 12/24/23 1114)  sodium chloride 0.9 % bolus 1,000 mL (1,000 mLs Intravenous New Bag/Given 12/24/23 1110)  albuterol (PROVENTIL) (2.5 MG/3ML) 0.083% nebulizer solution 10 mg (10 mg Nebulization Given 12/24/23 1113)    ED Course/ Medical Decision Making/ A&P                                 Medical Decision Making Amount and/or Complexity of Data Reviewed Labs: ordered. Radiology:  ordered.  Risk Prescription drug management. Decision regarding hospitalization.   Patient arrives as outlined.  He arrives with significant medical illness.  He was identified to be hypotensive at 68/38 by EMS.  He had some downtime at home with last known well cited as 11 PM.  Broad differential diagnosis including sepsis\ACS\CHF\pneumonia.  Will proceed with broad diagnostic evaluation.  Patient is started on sepsis protocol with fluid resuscitation for hypotension and hypothermia.  Lawyer initiated.  Awaiting family to room for further additional history.  Does not have significant history in EMR.  10: 25 labs just called and updated nursing that potassium is greater than 7.  Patient has peaked EKG waves will initiate hyperkalemia protocol.  Patient's blood pressure has improved with hydration with lactated Ringer's.  Current blood pressure 108/56.  Labs positive for acute kidney failure with creatinine of 14 and BUN 170.  Potassium greater than 7.5.  Lactic acid 9.  I have personally reviewed patient's chest x-ray.  No significant focal infiltrates.  No pneumothorax present.  No appearance of significant vascular congestion.  11: 18 CT head still pending and nephrology consult pending.  Patient is showing signs of improvement with blood pressure.  Patient has had several blood pressures consistently over 100 systolic.  11: 30 consult Dr. Dewayne Shorter.  Will evaluate in the emergency department for admission to medical service.  11: 39 consult Dr. Glenna Fellows nephrology.  Will evaluate and consult for inpatient management.       Final Clinical Impression(s) / ED Diagnoses Final diagnoses:  Hyperkalemia  Sepsis, due to unspecified organism, unspecified whether acute organ dysfunction present [facility])  Acute renal failure, unspecified acute renal failure type [facility])    Rx / DC Orders ED Discharge Orders     None         Arby Barrette, MD 12/24/23 1140

## 2023-12-24 NOTE — Progress Notes (Signed)
 Brief note 89 year old with history of CKD 4, HTN-who has had worsening weakness for several weeks-some loose stools but unclear if this is diarrhea-found obtunded by son this morning.  Profoundly hypothermic-confused/lethargic-lab work demonstrates severe renal failure with hyperkalemia and metabolic acidosis.  Long discussion with patient's son at bedside-suspect that this is probably progression to ESRD-however his numbers are currently incompatible with life.  We briefly discussed dialysis-and that more likely than not -this will be permanent-and that he will require dialysis 3-4 times a week.  After further discussion-patient's son and does not want to pursue any heroic measures including dialysis-and wants to keep his father comfortable.  He understands that patient is very close to end-of-life and will likely pass in a matter for few hours to a day.  Have discussed with Dr. Dyke Brackett agrees with above.  Starting Dilaudid infusion-expect inpatient death-he is not stable to be transferred to residential hospice.    Full H&P to follow soon.

## 2023-12-24 NOTE — H&P (Signed)
 History and Physical    Patient: Nicholas Beasley BJY:[id number] DOB: 1927/07/05 DOA: 12/24/2023 DOS: the patient was seen and examined on 12/24/2023 PCP: Irena Reichmann, DO  Patient coming from: Home  Chief Complaint:  Chief Complaint  Patient presents with   Hypotension   Altered Mental Status   HPI: Nicholas Beasley is a 89 y.o. male with medical history significant of HTN, CKD 4-who was brought to the ED after his son found him obtunded/confused/lethargic this morning.  Per patient's son-for the past several weeks-patient has gotten much weaker.  He has also complained of some loose stools but son is not sure if this was actually diarrhea.  There is no history of nausea vomiting.  Appetite has been stable.  Patient was awake/alert till yesterday evening-this morning when the patient's son went to check on him-he was obtunded barely responsive.  Patient was brought to the ED where he was found to be hypothermic-lethargic-Labs revealed severe AKI with hyperkalemia and metabolic acidosis.  Patient was given IV fluid boluses-given IV calcium gluconate-started on bicarb infusion-TRH was asked to admit this patient for further evaluation and treatment.  This MD subsequently arrived at bedside-had a long discussion with patient's son-after extensive discussion-decision has been made to transition him to full comfort measures.  Review of Systems: Unable to review all systems due to lack of cooperation from patient. Past Medical History:  Diagnosis Date   Arthritis    Bronchiectasis (HCC)    COPD (chronic obstructive pulmonary disease) (HCC)    Lung nodules    Past Surgical History:  Procedure Laterality Date   ANKLE FRACTURE SURGERY Left 40 YRS AGO   LEFT   JOINT REPLACEMENT Left 2008    L KNEE   REPLACEMENT TOTAL KNEE Right    ROTATOR CUFF REPAIR Left 1993   LEFT   TOTAL KNEE ARTHROPLASTY  06/18/2012   Procedure: TOTAL KNEE ARTHROPLASTY;  Surgeon: Shelda Pal, MD;  Location: [facility];   Service: Orthopedics;  Laterality: Right;   Social History:  reports that he quit smoking about 64 years ago. His smoking use included cigarettes. He started smoking about 74 years ago. He has a 20 pack-year smoking history. He has never used smokeless tobacco. He reports that he does not drink alcohol and does not use drugs.  No Known Allergies  Family History  Problem Relation Age of Onset   Heart Problems Mother        enlarged heart   Other Father        brain tumor    Prior to Admission medications   Medication Sig Start Date End Date Taking? Authorizing Provider  amLODipine (NORVASC) 5 MG tablet Take 1 tablet (5 mg total) by mouth daily. 09/21/19 12/24/23 Yes Virgina Norfolk, DO    Physical Exam: Vitals:   12/24/23 1045 12/24/23 1100 12/24/23 1115 12/24/23 1130  BP: (!) 113/54 (!) 92/52 (!) 106/57 (!) 115/59  Pulse: 66 67 69 70  Resp: (!) 25 (!) 23 (!) 27 (!) 27  Temp: (!) 90 F (32.2 C) (!) 90.4 F (32.4 C) (!) 90.7 F (32.6 C) (!) 91 F (32.8 C)  TempSrc:      SpO2: 100% 100% 100% 100%  Weight:      Height:       Gen Exam: Tachypnea-in distress-frail-sick appearing. HEENT:atraumatic, normocephalic Chest: B/L clear to auscultation anteriorly-with some transmitted upper airway sounds bilaterally. CVS:S1S2 regular Abdomen:soft non tender, non distended Extremities:no edema Neurology: Difficult exam-moving all 4 extremities. Skin: no  rash   Data Reviewed:     Latest Ref Rng & Units 12/24/2023    8:58 AM 09/21/2019    5:21 PM 06/21/2012    7:30 AM  CBC  WBC 4.0 - 10.5 K/uL 10.4  5.7    Hemoglobin 13.0 - 17.0 g/dL 40.9  81.1  8.4   Hematocrit 39.0 - 52.0 % 32.1  35.4  24.2   Platelets 150 - 400 K/uL 188  191          Latest Ref Rng & Units 12/24/2023    8:58 AM 09/21/2019    5:21 PM 06/20/2012    3:56 AM  BMP  Glucose 70 - 99 mg/dL 98  914  782   BUN 8 - 23 mg/dL 956  18  25   Creatinine 0.61 - 1.24 mg/dL 21.30  8.65  7.84   Sodium 135 - 145 mmol/L 140   135  128   Potassium 3.5 - 5.1 mmol/L >7.5  4.9  4.6   Chloride 98 - 111 mmol/L 108  102  97   CO2 22 - 32 mmol/L 7  24  24    Calcium 8.9 - 10.3 mg/dL 7.9  8.9  8.4     Twelve-lead EKG: Sinus rhythm with tall peaked T waves.  Assessment and Plan: AKI on CKD 4-likely progression to ESRD with severe hyperkalemia and metabolic acidosis Suspect some AKI component secondary to possible diarrhea-but looks like this is progression to ESRD.  Does not appear to be obstructive-as no significant urine output after Foley catheter inserted.  After extensive discussion with patient's son at bedside-given severity of illness-patient is known wishes not to prolong his life-we have decided to forego dialysis/nephrology evaluation and transition to full comfort measures.  Starting Dilaudid infusion-expect him to pass in a matter of a few hours to a day or so.  Not a candidate to transfer to residential hospice-expect inpatient death.  Keep Foley in place for comfort.  Hypothermia Acute metabolic encephalopathy Secondary to uremia Comfort measures in progress  Lactic acidosis Suspect this is due to dehydration/impaired renal clearance Do not think this is sepsis-sepsis ruled out.  Normocytic anemia Likely due to AKI/ESRD Comfort measures  HTN BP soft Comfort measures-no point in resuming antihypertensives  Palliative care DNR Long discussion with patient's son-this is likely progression to ESRD-poor long-term candidate for HD-son does not want to pursue aggressive care.  His current numbers/electrolytes are incompatible with life.  Son wants to keep the patient comfortable and transition to full comfort measures.  Starting Dilaudid infusion.  Expect inpatient death as noted above.   Advance Care Planning:   Code Status: Do not attempt resuscitation (DNR) - Comfort care   Consults: Phone curbside consult with Dr. Ardeen Jourdain  Family Communication: Son at bedside.  Severity of Illness: The  appropriate patient status for this patient is INPATIENT. Inpatient status is judged to be reasonable and necessary in order to provide the required intensity of service to ensure the patient's safety. The patient's presenting symptoms, physical exam findings, and initial radiographic and laboratory data in the context of their chronic comorbidities is felt to place them at high risk for further clinical deterioration. Furthermore, it is not anticipated that the patient will be medically stable for discharge from the hospital within 2 midnights of admission.   * I certify that at the point of admission it is my clinical judgment that the patient will require inpatient hospital care spanning beyond 2 midnights from the point of  admission due to high intensity of service, high risk for further deterioration and high frequency of surveillance required.*  Author: Jeoffrey Massed, MD 12/24/2023 12:04 PM  For on call review [email or web address].

## 2023-12-24 NOTE — Sepsis Progress Note (Signed)
 Code sepsis protocol being monitored by eLink.

## 2023-12-24 NOTE — ED Notes (Signed)
 Pt is starting to get increasingly restless.  Pt's son at bedside trying calm Pt.

## 2023-12-25 DIAGNOSIS — E875 Hyperkalemia: Secondary | ICD-10-CM | POA: Diagnosis not present

## 2023-12-25 DIAGNOSIS — N19 Unspecified kidney failure: Secondary | ICD-10-CM | POA: Insufficient documentation

## 2023-12-25 DIAGNOSIS — Z66 Do not resuscitate: Secondary | ICD-10-CM | POA: Insufficient documentation

## 2023-12-25 DIAGNOSIS — D689 Coagulation defect, unspecified: Secondary | ICD-10-CM | POA: Insufficient documentation

## 2023-12-25 DIAGNOSIS — E872 Acidosis, unspecified: Secondary | ICD-10-CM | POA: Insufficient documentation

## 2023-12-25 DIAGNOSIS — N184 Chronic kidney disease, stage 4 (severe): Secondary | ICD-10-CM

## 2023-12-25 DIAGNOSIS — I959 Hypotension, unspecified: Secondary | ICD-10-CM | POA: Insufficient documentation

## 2023-12-25 DIAGNOSIS — I10 Essential (primary) hypertension: Secondary | ICD-10-CM | POA: Insufficient documentation

## 2023-12-25 DIAGNOSIS — Z515 Encounter for palliative care: Secondary | ICD-10-CM

## 2023-12-25 DIAGNOSIS — D539 Nutritional anemia, unspecified: Secondary | ICD-10-CM | POA: Insufficient documentation

## 2023-12-25 DIAGNOSIS — G9349 Other encephalopathy: Secondary | ICD-10-CM | POA: Insufficient documentation

## 2023-12-25 DIAGNOSIS — T68XXXA Hypothermia, initial encounter: Secondary | ICD-10-CM | POA: Insufficient documentation

## 2023-12-25 LAB — BLOOD CULTURE ID PANEL (REFLEXED) - BCID2

## 2023-12-25 DEATH — deceased

## 2023-12-26 LAB — CULTURE, BLOOD (ROUTINE X 2): Culture  Setup Time: NO GROWTH

## 2023-12-29 LAB — CULTURE, BLOOD (ROUTINE X 2)
Culture: NO GROWTH
Special Requests: ADEQUATE

## 2024-01-24 NOTE — Progress Notes (Signed)
    1610  Spiritual Encounters  Conversation partners present during encounter Nurse  Reason for visit Patient death  OnCall Visit No   Responded to patient's death. Spoke with nurses. Patient's son left before transition. Honor bridge notified. Advised staff to call chaplain if family members return.

## 2024-01-24 NOTE — Progress Notes (Signed)
 IV Dilauded 48 ml (50mg /76ml) wasted at 0840 am. [place] LPN witnessed.

## 2024-01-24 NOTE — Progress Notes (Signed)
 Patient passed at 0826 am. Verified with second RN , Denny Peon. Made the provider aware. Made the [facility] aware.

## 2024-01-24 NOTE — Death Summary Note (Signed)
 DEATH SUMMARY   Patient Details  Name: Nicholas Beasley MRN: [id number] DOB: [birth year] WUJ:[id number], [name], DO Admission/Discharge Information   Admit Date:  01/01/2024  Date of Death: Date of Death: 01-02-24  Time of Death: Time of Death: 0826  Length of Stay: 1   Principle Cause of death: Uremic encephalopathy  Hospital Diagnoses: Principal Problem:   Acute renal failure superimposed on stage 4 chronic kidney disease (HCC) Active Problems:   Hypothermia   Uremia   Uremic encephalopathy   Hyperkalemia   Lactic acidosis   Hypotension   Macrocytic anemia   Coagulopathy (HCC)   Essential hypertension   End of life care   DNR no code (do not resuscitate)   Hospital Course: 89 year old M with PMH of HTN and CKD-4 brought to ED after found confused, obtunded and lethargic by his son the morning of his presentation.  Patient was very encephalopathic.  History provided by patient's son.  Per H&P, "patient's son-for the past several weeks-patient has gotten much weaker. He has also complained of some loose stools but son is not sure if this was actually diarrhea. There is no history of nausea vomiting. Appetite has been stable. Patient was awake/alert till yesterday evening-this morning when the patient's son went to check on him-he was obtunded barely responsive. Patient was brought to the ED where he was found to be hypothermic, hypotensive, encephalopathic and lethargic. Labs revealed severe AKI with uremia, hyperkalemia, metabolic acidosis and lactic acidosis. Patient was given IV fluid boluses-given IV calcium gluconate-started on bicarb infusion-TRH was asked to admit this patient for further evaluation and treatment".  After goal of care discussion between the admitting provider and patient's son, patient does transition to full comfort care and admitted for end-of-life care.  Patient passed away on Jan 02, 2024.  Patient's son notified over the phone.       Procedures:  None  Consultations: None  The results of significant diagnostics from this hospitalization (including imaging, microbiology, ancillary and laboratory) are listed below for reference.   Significant Diagnostic Studies: DG Chest Port 1 View Result Date: 2024-01-01 CLINICAL DATA:  Concern for sepsis. EXAM: PORTABLE CHEST 1 VIEW COMPARISON:  Chest radiograph dated 09/27/2022. FINDINGS: The heart size and mediastinal contours are within normal limits. Aortic atherosclerosis. Mild biapical pleuroparenchymal scarring. Bibasilar bronchiectatic changes, similar to the prior exam. No focal consolidation, sizeable pleural effusion, or pneumothorax. No acute osseous abnormality. IMPRESSION: 1. No acute cardiopulmonary findings. 2. Bibasilar bronchiectasis, not significantly changed since the prior exam. Electronically Signed   By: Hart Robinsons M.D.   On: 01-Jan-2024 10:23    Microbiology: Recent Results (from the past 240 hours)  Resp panel by RT-PCR (RSV, Flu A&B, Covid) Anterior Nasal Swab     Status: None   Collection Time: Jan 01, 2024  8:51 AM   Specimen: Anterior Nasal Swab  Result Value Ref Range Status   SARS Coronavirus 2 by RT PCR NEGATIVE NEGATIVE Final   Influenza A by PCR NEGATIVE NEGATIVE Final   Influenza B by PCR NEGATIVE NEGATIVE Final    Comment: (NOTE) The Xpert Xpress SARS-CoV-2/FLU/RSV plus assay is intended as an aid in the diagnosis of influenza from Nasopharyngeal swab specimens and should not be used as a sole basis for treatment. Nasal washings and aspirates are unacceptable for Xpert Xpress SARS-CoV-2/FLU/RSV testing.  Fact Sheet for Patients: BloggerCourse.com  Fact Sheet for Healthcare Providers: SeriousBroker.it  This test is not yet approved or cleared by the [place] and has been authorized for  detection and/or diagnosis of SARS-CoV-2 by FDA under an Emergency Use Authorization (EUA). This EUA will  remain in effect (meaning this test can be used) for the duration of the COVID-19 declaration under Section 564(b)(1) of the Act, 21 U.S.C. section 360bbb-3(b)(1), unless the authorization is terminated or revoked.     Resp Syncytial Virus by PCR NEGATIVE NEGATIVE Final    Comment: (NOTE) Fact Sheet for Patients: BloggerCourse.com  Fact Sheet for Healthcare Providers: SeriousBroker.it  This test is not yet approved or cleared by the [place] FDA and has been authorized for detection and/or diagnosis of SARS-CoV-2 by FDA under an Emergency Use Authorization (EUA). This EUA will remain in effect (meaning this test can be used) for the duration of the COVID-19 declaration under Section 564(b)(1) of the Act, 21 U.S.C. section 360bbb-3(b)(1), unless the authorization is terminated or revoked.  Performed at [facility], [address] [place], [place] [address]   Blood Culture (routine x 2)     Status: None (Preliminary result)   Collection Time: 12/24/23  8:51 AM   Specimen: BLOOD LEFT WRIST  Result Value Ref Range Status   Specimen Description BLOOD LEFT WRIST  Final   Special Requests   Final    BOTTLES DRAWN AEROBIC AND ANAEROBIC Blood Culture results may not be optimal due to an inadequate volume of blood received in culture bottles   Culture  Setup Time   Final    GRAM POSITIVE COCCI IN CLUSTERS AEROBIC BOTTLE ONLY Organism ID to follow Performed at [facility], [address], [place], [place] [address]    Culture GRAM POSITIVE COCCI  Final   Report Status PENDING  Incomplete  Blood Culture (routine x 2)     Status: None (Preliminary result)   Collection Time: 12/24/23  9:05 AM   Specimen: BLOOD  Result Value Ref Range Status   Specimen Description BLOOD RIGHT ANTECUBITAL  Final   Special Requests   Final    BOTTLES DRAWN AEROBIC AND ANAEROBIC Blood Culture adequate volume   Culture   Final    NO  GROWTH < 24 HOURS Performed at [facility], [address], [place], [place] [address]    Report Status PENDING  Incomplete    Time spent: 35 minutes  Signed: Almon Hercules, MD 

## 2024-01-24 DEATH — deceased
